# Patient Record
Sex: Male | Born: 1960
Health system: Southern US, Community
[De-identification: ages and names within clinical notes are randomized; demographics above are authoritative.]

## PROBLEM LIST (undated history)

## (undated) DIAGNOSIS — M1990 Unspecified osteoarthritis, unspecified site: Secondary | ICD-10-CM

## (undated) DIAGNOSIS — M549 Dorsalgia, unspecified: Secondary | ICD-10-CM

## (undated) DIAGNOSIS — Z8719 Personal history of other diseases of the digestive system: Secondary | ICD-10-CM

## (undated) DIAGNOSIS — G4730 Sleep apnea, unspecified: Secondary | ICD-10-CM

## (undated) DIAGNOSIS — K921 Melena: Secondary | ICD-10-CM

## (undated) HISTORY — DX: Dorsalgia, unspecified: M54.9

## (undated) HISTORY — DX: Sleep apnea, unspecified: G47.30

## (undated) HISTORY — DX: Personal history of other diseases of the digestive system: Z87.19

## (undated) HISTORY — DX: Melena: K92.1

## (undated) HISTORY — PX: KNEE SURGERY: SHX244

## (undated) HISTORY — DX: Unspecified osteoarthritis, unspecified site: M19.90

## (undated) HISTORY — PX: COLONOSCOPY: SHX174

---

## 1999-09-04 ENCOUNTER — Encounter: Payer: Self-pay | Admitting: Emergency Medicine

## 1999-09-04 ENCOUNTER — Emergency Department (HOSPITAL_COMMUNITY): Admission: EM | Admit: 1999-09-04 | Discharge: 1999-09-04 | Payer: Self-pay | Admitting: Emergency Medicine

## 1999-09-09 ENCOUNTER — Ambulatory Visit (HOSPITAL_COMMUNITY): Admission: RE | Admit: 1999-09-09 | Discharge: 1999-09-09 | Payer: Self-pay | Admitting: Urology

## 1999-09-09 ENCOUNTER — Encounter: Payer: Self-pay | Admitting: Urology

## 2007-04-20 ENCOUNTER — Emergency Department (HOSPITAL_COMMUNITY): Admission: EM | Admit: 2007-04-20 | Discharge: 2007-04-20 | Payer: Self-pay | Admitting: Emergency Medicine

## 2007-04-22 ENCOUNTER — Ambulatory Visit (HOSPITAL_COMMUNITY): Admission: RE | Admit: 2007-04-22 | Discharge: 2007-04-22 | Payer: Self-pay | Admitting: Urology

## 2007-10-27 ENCOUNTER — Emergency Department (HOSPITAL_COMMUNITY): Admission: EM | Admit: 2007-10-27 | Discharge: 2007-10-27 | Payer: Self-pay | Admitting: Emergency Medicine

## 2007-11-04 ENCOUNTER — Ambulatory Visit (HOSPITAL_BASED_OUTPATIENT_CLINIC_OR_DEPARTMENT_OTHER): Admission: RE | Admit: 2007-11-04 | Discharge: 2007-11-04 | Payer: Self-pay | Admitting: Otolaryngology

## 2007-11-25 HISTORY — PX: OTHER SURGICAL HISTORY: SHX169

## 2008-07-18 ENCOUNTER — Observation Stay (HOSPITAL_COMMUNITY): Admission: EM | Admit: 2008-07-18 | Discharge: 2008-07-19 | Payer: Self-pay | Admitting: Emergency Medicine

## 2008-07-18 ENCOUNTER — Ambulatory Visit: Payer: Self-pay | Admitting: Family Medicine

## 2008-07-26 ENCOUNTER — Ambulatory Visit: Payer: Self-pay

## 2009-08-26 ENCOUNTER — Emergency Department (HOSPITAL_COMMUNITY): Admission: EM | Admit: 2009-08-26 | Discharge: 2009-08-26 | Payer: Self-pay | Admitting: Family Medicine

## 2010-07-05 ENCOUNTER — Ambulatory Visit: Payer: Self-pay | Admitting: Family Medicine

## 2010-07-05 DIAGNOSIS — S336XXA Sprain of sacroiliac joint, initial encounter: Secondary | ICD-10-CM | POA: Insufficient documentation

## 2010-07-05 DIAGNOSIS — Z87442 Personal history of urinary calculi: Secondary | ICD-10-CM | POA: Insufficient documentation

## 2010-07-05 DIAGNOSIS — M545 Low back pain: Secondary | ICD-10-CM | POA: Insufficient documentation

## 2010-07-12 ENCOUNTER — Telehealth (INDEPENDENT_AMBULATORY_CARE_PROVIDER_SITE_OTHER): Payer: Self-pay | Admitting: *Deleted

## 2010-09-17 IMAGING — CR DG ANKLE COMPLETE 3+V*R*
3 series · 3 of 3 positions shown · non-contrast
Comparison: None

CLINICAL DATA: Posterior ankle pain for 3 days.  No known injury.

RIGHT ANKLE - COMPLETE 3+ VIEW

[view not recorded (1 of 3)]
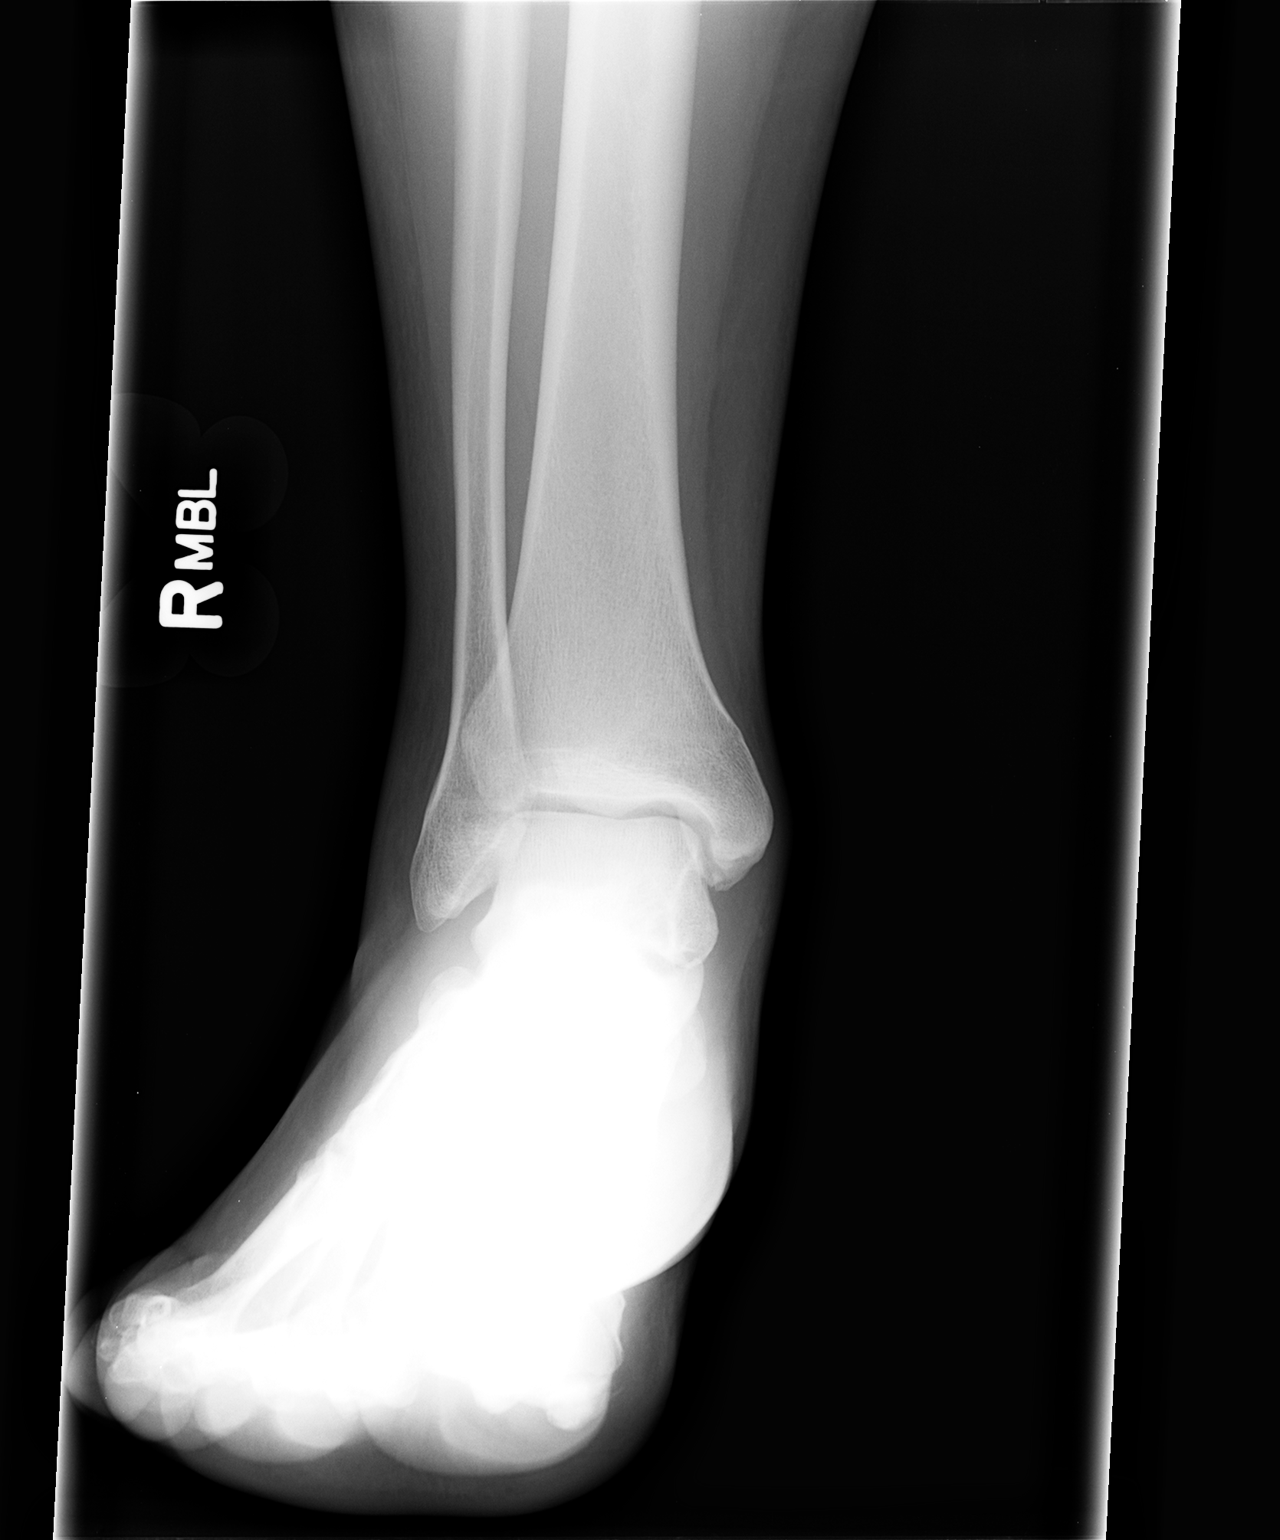

[view not recorded (2 of 3)]
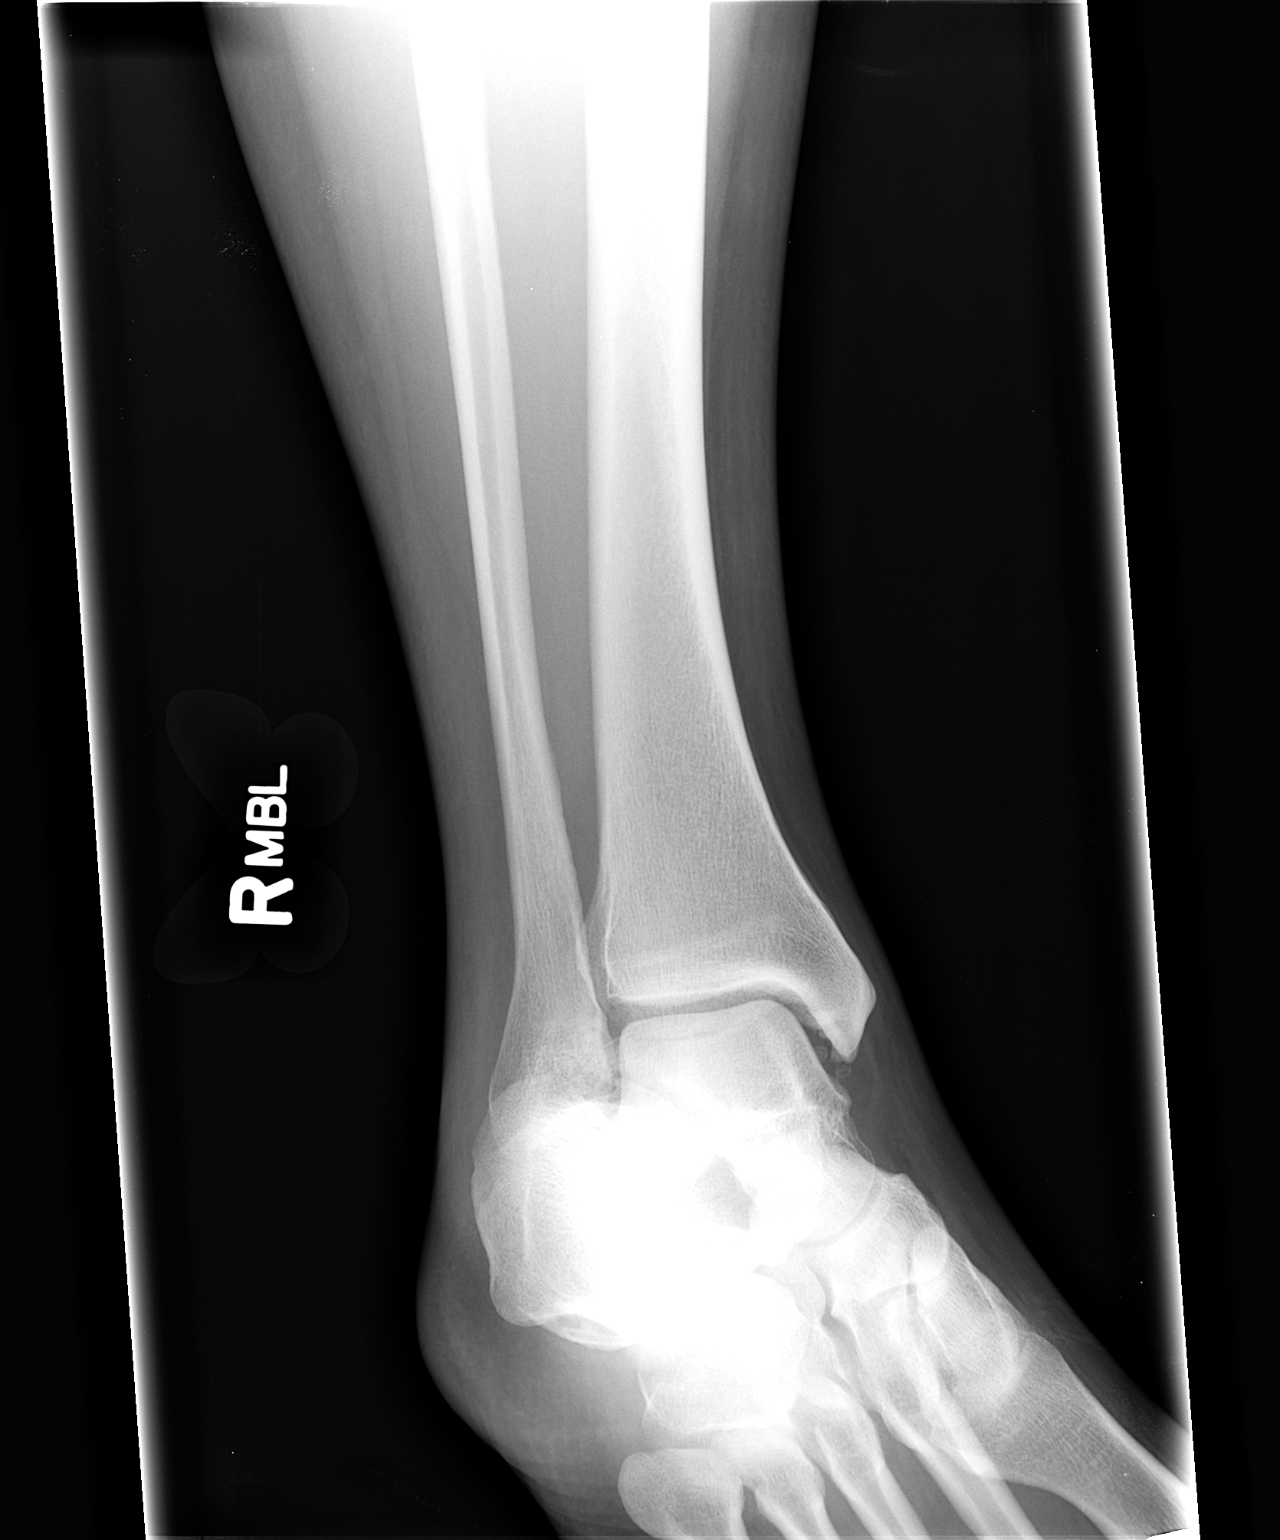

[view not recorded (3 of 3)]
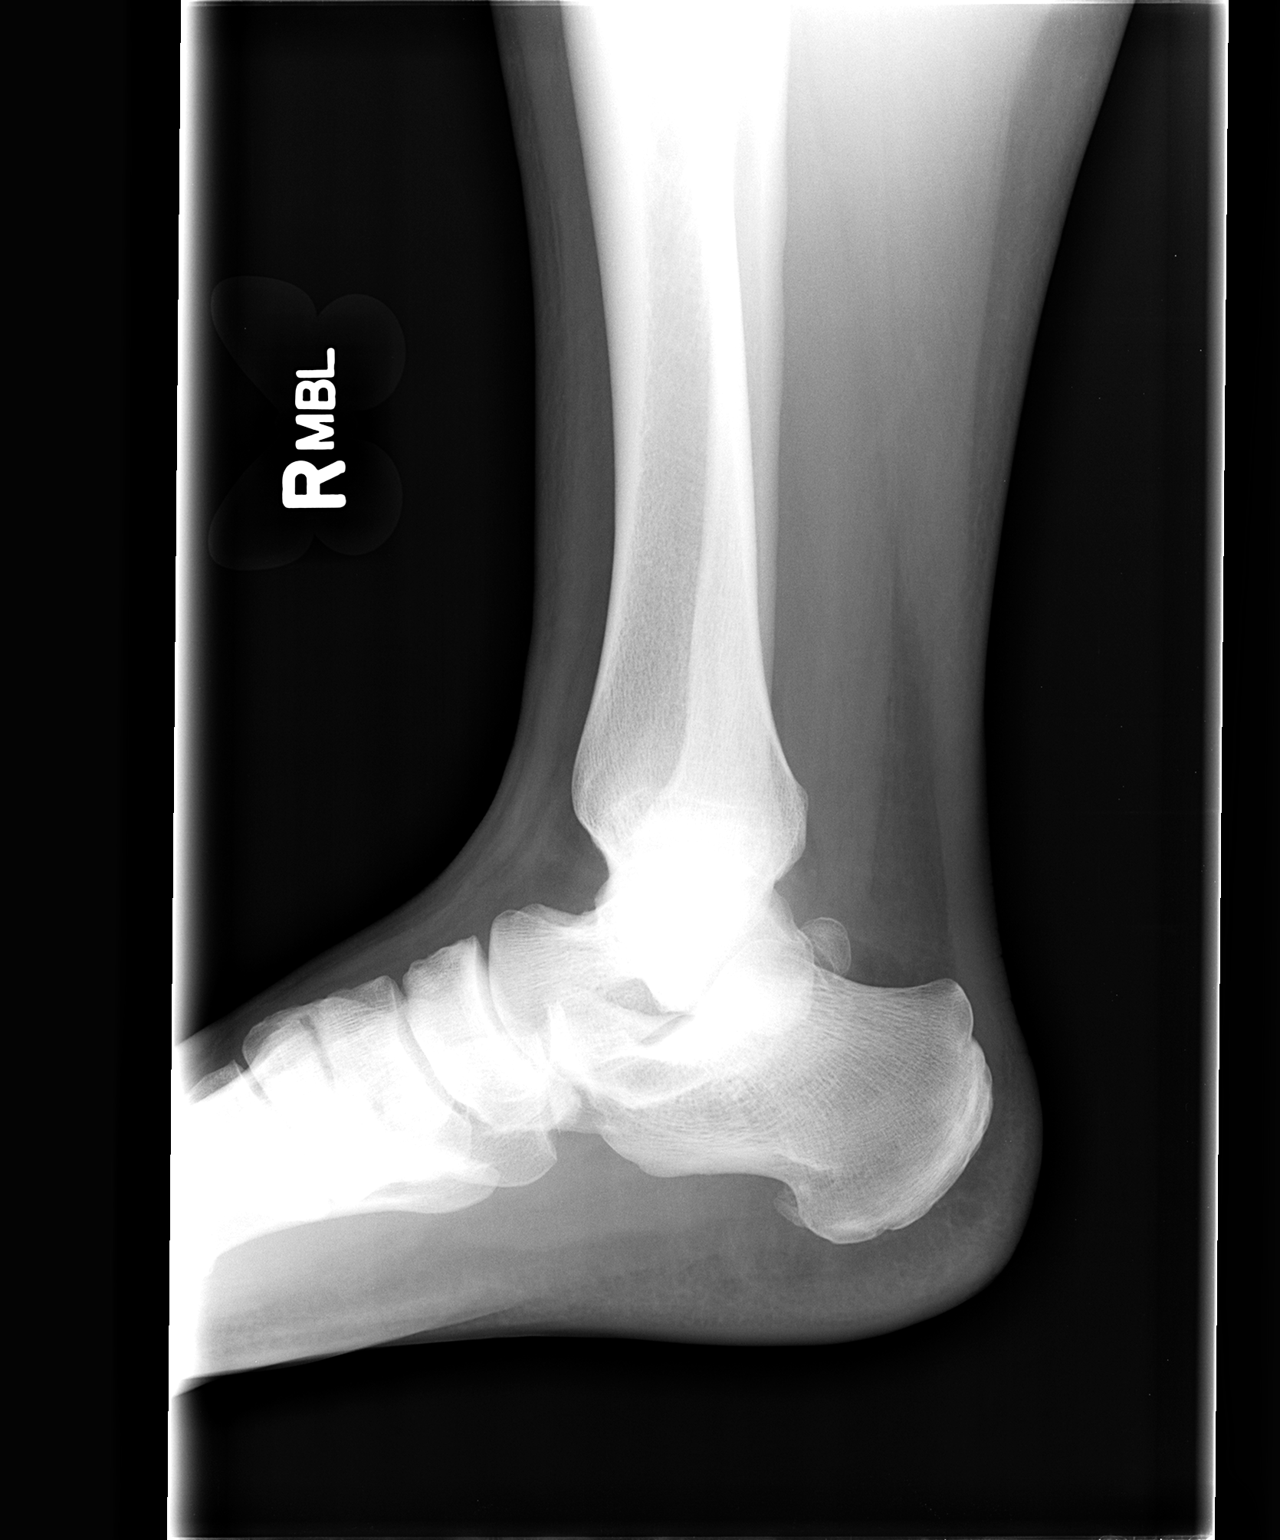

[3 of 3 positions shown; findings below may reference images not displayed]

FINDINGS: Mineralization and alignment are normal.  There is no
evidence of acute fracture or dislocation.  No focal soft tissue
swelling is evident.  Ossific densities adjacent to the medial
malleolus are best seen on the oblique view and do not appear
acute.  These may be related to prior trauma.
IMPRESSION: No acute osseous findings.

## 2010-12-24 NOTE — Progress Notes (Signed)
  Phone Note Outgoing Call Call back at [facility] Phone [phone]   Call placed by: Lajean Saver RN,  July 12, 2010 1:05 PM Call placed to: Patient Details for Reason: Call back Summary of Call: Call back- no answer, left message on machine stating reason for call and to call back with any further questions.

## 2010-12-24 NOTE — Assessment & Plan Note (Signed)
Summary: LOW BACK PAIN/WB   Vital Signs:  Patient Profile:   50 Years Old Male CC:      low back pain and right testicle pain 2 weeks Height:     68 inches Weight:      244 pounds O2 Sat:      98 % O2 treatment:    Room Air Temp:     98.0 degrees F oral Pulse rate:   58 / minute Resp:     14 per minute BP sitting:   115 / 80  (left arm) Cuff size:   large  Pt. in pain?   yes    Location:   lower back    Intensity:   8    Type:       sharp  Vitals Entered By: Lajean Saver RN (July 05, 2010 9:06 AM)                   Updated Prior Medication List: No Medications History of Present Illness Chief Complaint: low back pain and right testicle pain 2 weeks History of Present Illness:  Subjective:  Patient complains of several week history of vague mild right lower back tenderness that has become worse over the past week.  The pain is dull and radiates to his right lateral/anterior thigh and somewhat to the right testicle.  The pain resolves when he is supine on his back and is worse when bending over and with physical activity.  No urinary symptoms.  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 He has a history of renal stones.  About 2 years ago he had several kidney stones requiring surgical extraction.  No fevers, chills, and sweats. He states that he had a back X-ray about one year ago that was normal.  REVIEW OF SYSTEMS Constitutional Symptoms      Denies fever, chills, night sweats, weight loss, weight gain, and fatigue.  Eyes       Denies change in vision, eye pain, eye discharge, glasses, contact lenses, and eye surgery. Ear/Nose/Throat/Mouth       Denies hearing loss/aids, change in hearing, ear pain, ear discharge, dizziness, frequent runny nose, frequent nose bleeds, sinus problems, sore throat, hoarseness, and tooth pain or bleeding.  Respiratory       Denies dry  cough, productive cough, wheezing, shortness of breath, asthma, bronchitis, and emphysema/COPD.  Cardiovascular       Denies murmurs, chest pain, and tires easily with exhertion.    Gastrointestinal       Denies stomach pain, nausea/vomiting, diarrhea, constipation, blood in bowel movements, and indigestion. Genitourniary       Denies painful urination, kidney stones, and loss of urinary control. Neurological       Denies paralysis, seizures, and fainting/blackouts. Musculoskeletal       Complains of muscle pain.      Denies joint pain, joint stiffness, decreased range of motion, redness, swelling, muscle weakness, and gout.      Comments: low back pain radiating down right leg Skin       Denies bruising, unusual mles/lumps or sores, and hair/skin or nail changes.  Psych       Denies mood changes, temper/anger issues, anxiety/stress, speech problems, depression, and sleep problems. Other Comments: patient c/o lowe back pain that radiates down his right leg and pain in his right tesdticle x 2 weeks. Denies changes in urinary pattern  Past History:  Past Medical History: kidney stones  Past Surgical History: Denies surgical history  Family History: Family History of Prostate CA 1st degree relative <50- father  Social History: Theatre stage manager Never Smoked Alcohol use-no Drug use-no Smoking Status:  never Drug Use:  no   Objective:  Appearance:  Patient appears healthy, stated age, and in no acute distress  Neck:  nontender Lungs:  Clear to auscultation.  Breath sounds are equal.  Heart:  Regular rate and rhythm without murmurs, rubs, or gallops.  Abdomen:  Nontender without masses or hepatosplenomegaly.  Bowel sounds are present.  No CVA or flank tenderness.   Back:  Full range of motion.  There is distinct tenderness over the right SI joint.  FABER test cause low back tenderness.  Log roll test causes some right low back tenderness.  Straight leg raising test is negative.   Sitting knee extension test is negative.  Strength and sensation in the lower extremities is normal.  Patellar and achilles reflexes are normal.   Genitourinary:  Penis normal without lesions or urethral discharge.  Scrotum is normal.  Testes are descended bilaterally without nodules or tenderness.  No hernias are palpated.  No regional lymphadenopathy palpated.   Extremities:  No edema.  Pedal pulses are full and equal.  No tenderness over the right greater trochanter. urinalysis (dipstick):  negative Assessment New Problems: NEPHROLITHIASIS, HX OF (ICD-V13.01) SACROILIAC STRAIN (ICD-846.9) BACK PAIN, LUMBAR (ICD-724.2)  MUSCULOSKELETAL PAIN:  RIGHT SACROILIAC JOINT.  Plan New Medications/Changes: NAPROXEN 500 MG TABS (NAPROXEN) One by mouth two times a day pc  #20 x 1, 07/05/2010, Donna Christen MD  New Orders: Urinalysis [81003-65000] New Patient Level III [99203] Planning Comments:   Begin Naproxen.  Begin applying ice pack 2 or 3 times daily.  Begin exercises for SI joint ([employer] information and instruction patient handout given)  Avoid heavy lifting. If not improved 3 to 4 weeks, recommend evaluation by [facility].   The patient and/or caregiver has been counseled thoroughly with regard to medications prescribed including dosage, schedule, interactions, rationale for use, and possible side effects and they verbalize understanding.  Diagnoses and expected course of recovery discussed and will return if not improved as expected or if the condition worsens. Patient and/or caregiver verbalized understanding.  Prescriptions: NAPROXEN 500 MG TABS (NAPROXEN) One by mouth two times a day pc  #20 x 1   Entered and Authorized by:   Donna Christen MD   Signed by:   Donna Christen MD on 07/05/2010   Method used:   Print then Give to Patient   RxID:   [id number]   Orders Added: 1)  Urinalysis [81003-65000] 2)  New Patient Level III [14782]  Appended Document:  LOW BACK PAIN/WB UA: Glucose: Negative Bil:Neg Ket: Neg SG>=1.030 Blood: Negative pH 5.5 Protein: trace URO: 0.2 Nit: Neg NFA:[id number] Yellow and cloudy Lajean Saver, RN

## 2011-02-27 LAB — POCT I-STAT, CHEM 8
BUN: 18 mg/dL (ref 6–23)
Calcium, Ion: 1.15 mmol/L (ref 1.12–1.32)
Glucose, Bld: 92 mg/dL (ref 70–99)
Hemoglobin: 14.6 g/dL (ref 13.0–17.0)
Potassium: 4.4 mEq/L (ref 3.5–5.1)
Sodium: 139 mEq/L (ref 135–145)

## 2011-02-27 LAB — TSH: TSH: 1.265 u[IU]/mL (ref 0.350–4.500)

## 2011-04-08 NOTE — Op Note (Signed)
NAME:  RAFAY, DAHAN NO.:  [id number]   MEDICAL RECORD NO.:  [id number]          PATIENT TYPE:  AMB   LOCATION:  DAY                          FACILITY:  [facility]   PHYSICIAN:  Heloise Purpura, MD      DATE OF BIRTH:  [birth year]   DATE OF PROCEDURE:  04/22/2007  DATE OF DISCHARGE:                               OPERATIVE REPORT   PREOPERATIVE DIAGNOSIS:  Right ureteral calculus.   POSTOPERATIVE DIAGNOSIS:  Right ureteral calculus.   PROCEDURE:  1. Cystoscopy.  2. Right retrograde pyelography.  3. Right ureteroscopy with stone removal.  4. Right ureteral stent placement.   SURGEON:  Heloise Purpura, M.D.   ANESTHESIA:  General.   COMPLICATIONS:  None.   INDICATIONS:  Mr. Nemetz is a 50 year old gentleman with a distal right  ureteral calculus measuring approximately 4 mm.  After discussing  management options, he elected to proceed with ureteroscopic stone  removal.  The potential risks and benefits of the above procedures were  discussed with the patient and informed consent was obtained.   DESCRIPTION OF PROCEDURE:  The patient was taken to the operating room  and a general anesthetic was administered.  He was given preoperative  antibiotics, placed in the dorsal lithotomy position, and prepped and  draped in the usual sterile fashion.  Next, a preoperative time out was  performed.  Cystourethroscopy was performed which demonstrated no  obvious bladder lesions or abnormalities.  The right ureteral orifice  was then identified and cannulated with a 6 French ureteral catheter.  Retrograde pyelography was performed which demonstrated a filling defect  in the distal ureter consistent with the patient's known stone.  A 0.038  sensor guidewire was then advanced into the ureter and up into the renal  pelvis under fluoroscopic guidance without difficulty.  The distal  ureter was then dilated with a 4 cm ureteral balloon dilator.  The 6  French semi-rigid ureteroscope  was then used to perform ureteroscopy.  The patient's stone was identified and was extracted with a nitinol  basket.  This was sent for stone analysis.  The patient's wire was then  back loaded over the cystoscope sheath and a 6 x 24 double-J ureteral  stent with a string tether catheter was placed.  The stent was  positioned under fluoroscopic and cystoscopic guidance.  The wire was removed with a good curl noted in the renal pelvis and a  curl in the bladder.  The patient's bladder was then emptied and the  procedure was ended.  He tolerated the procedure well without  complications.  He was able to be awakened and transferred to the  recovery unit in satisfactory condition.           ______________________________  Heloise Purpura, MD  Electronically Signed     LB/MEDQ  D:  04/22/2007  T:  04/22/2007  Job:  [id number]

## 2011-04-08 NOTE — H&P (Signed)
NAME:  Christopher Phelps, Christopher Phelps NO.:  [id number]   MEDICAL RECORD NO.:  [id number]          PATIENT TYPE:  EMS   LOCATION:  MAJO                         FACILITY:  [facility]   PHYSICIAN:  Nestor Ramp, MD        DATE OF BIRTH:  1961/07/26   DATE OF ADMISSION:  07/18/2008  DATE OF DISCHARGE:                              HISTORY & PHYSICAL   PRIMARY CARE Emmajane Altamura:  [facility].   CHIEF COMPLAINT:  Chest pain.   HISTORY OF PRESENT ILLNESS:  This is a 50 year old male with no past  medical history presenting with chest pain.  The patient has been having  this on and off times 1 month, occurs daily, not strongly associated  with exertion.  Sometimes, occurs with changes in position, and  sometimes, occurs with a sensation of a pounding heart.  This time, the  pain was relieved by nitroglycerine.  The patient describes the pain as  a pressure in the middle of chest.  The pain was never associated with  syncope or shortness of breath.  It is not pleuritic, it is not  reproducible by palpation.  Occasionally, radiates to the jaw and to the  left arm.  The patient also complained of a headache with blurry vision,  so the ED did a head CT.  The patient states that he grinds his teeth at  night and the pain goes away as the day progresses, it is relieved by  ibuprofen, and it has never awakened the patient from sleep.   PAST MEDICAL HISTORY:  Consistent only for a kidney stone in 2008, for  which the patient had a ureteral stent and the patient also has a  history of nasal fracture with closed reduction.   ALLERGIES:  Only to PENICILLIN.   The patient takes no medications.   FAMILY HISTORY:  Mother has a history of AFib and tachybrady syndrome  with a pacemaker.  There is no history of MI in the family.   SOCIAL HISTORY:  The patient works as a IT sales professional.  There is no  tobacco use, occasional EtOH use.  No illicit drugs.  Lives at home with  his wife  and children.   REVIEW OF SYSTEMS:  Denies fevers, chills, illness, cough, sore throat,  abdominal pain, nausea, vomiting, diarrhea, bloody or melanotic stool,  hematuria, dysuria, myalgias, arthralgia, numbness, tingling, or  weakness.   On exam, the patient's vital signs, his blood pressure was 112/68, pulse  63, respirations 18, temperature 97.5 degrees Fahrenheit, O2 saturation  98% on room air.  GENERAL:  The patient is alert and oriented x3 in no acute distress.  HEENT:  The patient is normocephalic and atraumatic.  Pupils are equal,  round, and reactive to light.  Extraocular muscles are intact.  Tympanic  membranes are normal bilaterally. Oropharynx are normal.  NECK:  Supple with no masses, no JVD, and no bruits.  CARDIAC:  Regular rate and rhythm with no murmurs, rubs, or gallops.  LUNGS:  Clear to auscultation bilaterally.  ABDOMEN:  Soft, nontender, and  nondistended.  Positive bowel sounds with  no masses.  EXTREMITIES:  The patient was moving all 4.  There was no edema and  pulses were equal.  NEUROLOGIC:  Cranial nerves II-XII within normal limits.  Strength 5/5  x4.  Sensation grossly intact.  Cerebellar function grossly intact.   LABS:  CBC, white count was 7.5, hemoglobin 14.9, hematocrit 44.2, and  platelets 249.  BMP, sodium 137, potassium is 5.7 found to be hemolyzed  and was 4.01 repeat, chloride was 103, bicarb 24, BUN 19, creatinine is  0.93, glucose is 87, calcium is 9.4, total protein 7.2, albumin 4.5,  alkaline phosphatase 66, AST 36, ALT 19, and total bilirubin 1.5.  BNP  was less than 30.  Chest x-ray was negative and head CT was negative.  Cardiac enzymes, point of care, were negative x2.   ASSESSMENT AND PLAN:  This is a 50 year old male approaching with chest  pain.  1. Chest pain.  We will rule out myocardial infarction, admit to      telemetry floor, placed on telemetry floor for observation.  We      will also do a fasting lipid panel to risk  stratify.  The patient      will have cardiac enzymes drawn x2 and will make a cardiac followup      on discharge.  The chest pain may me cardiac in origin versus      gastroesophageal reflux disease, so we will also do Protonix.  2. Headache, likely secondary to teeth grinding.  3. Disposition when cardiac enzymes are negative x2 and we will      discharge the patient with a followup to [facility] for a      outpatient stress test.      Rodney Langton, MD  Electronically Signed      Nestor Ramp, MD  Electronically Signed    TT/MEDQ  D:  07/18/2008  T:  07/19/2008  Job:  [id number]

## 2011-04-08 NOTE — Discharge Summary (Signed)
NAMELONDYN, WOTTON NO.:  [id number]   MEDICAL RECORD NO.:  [id number]          PATIENT TYPE:  OBV   LOCATION:  3704                         FACILITY:  [facility]   PHYSICIAN:  Nestor Ramp, MD        DATE OF BIRTH:  [birth year]   DATE OF ADMISSION:  07/18/2008  DATE OF DISCHARGE:  07/19/2008                               DISCHARGE SUMMARY   REASON FOR ADMISSION:  Chest pain.   HISTORY:  Briefly, this is a 50 year old male with no significant past  medical history who had been having chest pain on and off for 1 month;  it was daily, not strongly associated with exertion, sometimes occurred  with changes in position, and sometimes with a sensation of the pounding  heart.  This time, pain was relieved with nitroglycerin in the  ambulance.  It was never associated with syncope, never associated with  shortness of breath, not pleuritic, not reproducible by palpation, and  occasionally radiating to the jaw and left arm.   BRIEF HOSPITAL COURSE:  We admitted the patient to telemetry, drew  cardiac enzymes x2, and drew a fasting lipid panel.  Cardiac enzymes  were both negative.  EKG was normal and fasting lipid panel showed total  cholesterol of 17, triglycerides 78, HDL 40, LDL 114 on his labs,  however, myoglobin was increased to 238 and total CK was at 357 on his  last CK.  BNP was normal at discharge at 139 sodium, potassium 3.8,  chloride 104, bicarb 27, BUN 19, creatinine 0.88, and glucose 86.  The  patient's BNP level was less than 30.  Liver function tests were normal.  The patient's discharge condition is stable.   Discharge disposition will be to home.   Followups will be to [facility], Dr. Merla Riches, phone number there is [phone], July 26, 2008, at 3:15 p.m. and to [facility], the  number is [phone] on July 26, 2008, at 9:15 a.m. for an outpatient  stress test.   DISCHARGE MEDICATIONS:  Aspirin 81 mg p.o. daily.  Nitroglycerin 0.4 mg SL PRN  chest pain   INSTRUCTIONS:  No work or physical exertion until outpatient stress  test.  No Xanax before stress test.   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  We will follow this  up in a couple of months to make sure that they have come down.  Also,  the patient's total bilirubin was 1.5, which I suspect may be a Gilbert  syndrome.  This will also need to be followed up and clinically  correlated.      Rodney Langton, MD  Electronically Signed      Nestor Ramp, MD  Electronically Signed    TT/MEDQ  D:  07/19/2008  T:  07/20/2008  Job:  [id number]

## 2011-04-08 NOTE — Op Note (Signed)
Christopher Phelps, Christopher Phelps NO.:  [id number]   MEDICAL RECORD NO.:  [id number]          PATIENT TYPE:  AMB   LOCATION:  DSC                          FACILITY:  [facility]   PHYSICIAN:  Antony Contras, MD     DATE OF BIRTH:  [birth year]   DATE OF PROCEDURE:  11/04/2007  DATE OF DISCHARGE:                               OPERATIVE REPORT   PREOPERATIVE DIAGNOSIS:  Nasal fracture.   POSTOPERATIVE DIAGNOSIS:  Nasal fracture.   PROCEDURE:  Closed reduction of nasal fracture.   SURGEON:  Dr. Christia Reading.   ANESTHESIA:  MAC   COMPLICATIONS:  None.   INDICATIONS:  The patient is a 50 year old white male who was struck in  the nose with attic stairs about 10 days ago.  He sustained a concussion  as well as the nasal fracture.  As swelling has come down, the nose is a  little bit crooked to the left.  He presents to the operating room for  surgical management.   FINDINGS:  The septum is deviated toward the right and the right nasal  sidewall is concave.   DESCRIPTION OF PROCEDURE:  The patient is identified in holding room and  informed consent having been obtained including discussion of risks,  benefits, alternatives, the patient was brought to the operative suite  and put on the operating table in supine position.  Anesthesia was  induced.  The patient was maintained via mask ventilation.  Afrin  pledgets were placed in both sides of the nose for several minutes and  then removed.  A butter knife was then inserted in the nose and used to  push on the septum toward the left side.  It was then inserted  underneath the right nasal bone then used to elevate out the nasal bone  in bimanual method.  The left side was found to be stable.  The right  nasal bone, however fell back into the nose easily.  Thus, pledgets were  replaced in the nose for several minutes and removed.  A small Merocel  pack was then cut and placed up on the underneath the right nasal bone  to hold in  place.  Afrin was added to the pack.  After this, benzoin was  placed on the outer nose followed by Steri-Strips and a thermoplastic  splint.  The splint had to be replaced due to the anesthesia mask  deforming it.  After this was completed, the patient was returned  Anesthesia for wake-up and was removed recovery room in stable  condition.      Antony Contras, MD  Electronically Signed     DDB/MEDQ  D:  11/04/2007  T:  11/04/2007  Job:  [id number]

## 2012-01-14 ENCOUNTER — Encounter: Payer: Self-pay | Admitting: Gastroenterology

## 2012-02-18 ENCOUNTER — Encounter: Payer: Self-pay | Admitting: Gastroenterology

## 2012-08-09 ENCOUNTER — Other Ambulatory Visit: Payer: Self-pay | Admitting: Occupational Medicine

## 2012-08-09 ENCOUNTER — Ambulatory Visit (HOSPITAL_BASED_OUTPATIENT_CLINIC_OR_DEPARTMENT_OTHER)
Admission: RE | Admit: 2012-08-09 | Discharge: 2012-08-09 | Disposition: A | Discharge status: Home / Self Care | Payer: Managed Care, Other (non HMO) | Source: Ambulatory Visit | Attending: Occupational Medicine | Admitting: Occupational Medicine

## 2012-08-09 DIAGNOSIS — Z0000 Encounter for general adult medical examination without abnormal findings: Secondary | ICD-10-CM

## 2012-09-07 ENCOUNTER — Encounter: Payer: Self-pay | Admitting: Gastroenterology

## 2012-10-15 ENCOUNTER — Ambulatory Visit (INDEPENDENT_AMBULATORY_CARE_PROVIDER_SITE_OTHER): Payer: Managed Care, Other (non HMO) | Admitting: Family Medicine

## 2012-10-15 VITALS — BP 120/80 | HR 77 | Temp 98.4°F | Resp 18 | Ht 70.0 in | Wt 256.8 lb

## 2012-10-15 DIAGNOSIS — R062 Wheezing: Secondary | ICD-10-CM

## 2012-10-15 DIAGNOSIS — J40 Bronchitis, not specified as acute or chronic: Secondary | ICD-10-CM

## 2012-10-15 MED ORDER — AZITHROMYCIN 250 MG PO TABS: ORAL_TABLET | ORAL | Status: DC

## 2012-10-15 MED ORDER — HYDROCODONE-HOMATROPINE 5-1.5 MG/5ML PO SYRP: 5.0000 mL | ORAL_SOLUTION | Freq: Three times a day (TID) | ORAL | Status: DC | PRN

## 2012-10-15 MED ORDER — ALBUTEROL SULFATE (2.5 MG/3ML) 0.083% IN NEBU
2.5000 mg | INHALATION_SOLUTION | Freq: Once | RESPIRATORY_TRACT | Status: AC
  Administered 2012-10-15: 2.5 mg via RESPIRATORY_TRACT

## 2012-10-15 MED ORDER — ALBUTEROL SULFATE (2.5 MG/3ML) 0.083% IN NEBU: 2.5000 mg | INHALATION_SOLUTION | Freq: Four times a day (QID) | RESPIRATORY_TRACT | Status: DC | PRN

## 2012-10-15 NOTE — Progress Notes (Signed)
[facility] [address] [place] [address] [phone]  Date:  10/15/2012   Name:  Christopher Phelps   DOB:  1960/11/29   MRN:  [id number]  PCP:  No primary provider on file.    Chief Complaint: Cough, Nasal Congestion and Sore Throat   History of Present Illness:  Christopher Phelps is a 51 y.o. very pleasant male patient who presents with the following:  He is here today with a cough- he has had the cough for about 4 weeks.  He seemed to get better, but then got worse again about 4- 5 days ago.  He has chest tightness, but no wheezing.   Cough is productive. He also has a ST but the throat does not appear red.   He has not noted a fever at least not recently.  He does have body aches but no chills.    He does have sneezing and runny nose, but no severe sinus congestion.   No GI symptoms.    He is generally healthy except for chronic back pain.    He tried tylenol cold and sinus medications and some advil for HA.    Patient Active Problem List  Diagnosis  . BACK PAIN, LUMBAR  . SACROILIAC STRAIN  . NEPHROLITHIASIS, HX OF    History reviewed. No pertinent past medical history.  Past Surgical History  Procedure Date  . Kidney stones 2009    History  Substance Use Topics  . Smoking status: Never Smoker   . Smokeless tobacco: Never Used  . Alcohol Use: 1.5 oz/week    3 drink(s) per week    Family History  Problem Relation Age of Onset  . Heart disease Father     Allergies  Allergen Reactions  . Penicillins     rash    Medication list has been reviewed and updated.  No current outpatient prescriptions on file prior to visit.    Review of Systems:  As per HPI- otherwise negative.   Physical Examination: Filed Vitals:   10/15/12 0810  BP: 120/80  Pulse: 77  Temp: 98.4 F (36.9 C)  Resp: 18   Filed Vitals:   10/15/12 0810  Height: 5\' 10"  (1.778 m)  Weight: 256 lb 12.8 oz (116.484 kg)   Body mass index is 36.85  kg/(m^2). Ideal Body Weight: Weight in (lb) to have BMI = 25: 173.9   GEN: WDWN, NAD, Non-toxic, A & O x 3, overweight HEENT: Atraumatic, Normocephalic. Neck supple. No masses, No LAD. Bilateral TM wnl, oropharynx normal.  PEERL,EOMI.   Nasal congestion.  Ears and Nose: No external deformity. CV: RRR, No M/G/R. No JVD. No thrill. No extra heart sounds. PULM: CTA B, no wheezes, crackles, rhonchi. No retractions. No resp. distress. No accessory muscle use.  Reduced air movement EXTR: No c/c/e NEURO Normal gait.  PSYCH: Normally interactive. Conversant. Not depressed or anxious appearing.  Calm demeanor.   Albuterol neb: helped him to breathe more easily.  Did not change O2 sat   Assessment and Plan: 1. Bronchitis  azithromycin (ZITHROMAX) 250 MG tablet, HYDROcodone-homatropine (HYCODAN) 5-1.5 MG/5ML syrup, DISCONTINUED: albuterol (PROVENTIL) (2.5 MG/3ML) 0.083% nebulizer solution  2. Wheezing  albuterol (PROVENTIL) (2.5 MG/3ML) 0.083% nebulizer solution 2.5 mg   Treat as above.  He did not desire an albuterol inhaler for home.  Hycodan as needed for cough but cautioned about sedation.  See patient instructions for more details.     Abbe Amsterdam, MD

## 2012-10-15 NOTE — Patient Instructions (Addendum)
Please give me a call if you are not better in the next few days- Sooner if worse.    

## 2012-11-11 ENCOUNTER — Encounter: Payer: Self-pay | Admitting: Gastroenterology

## 2012-11-24 ENCOUNTER — Ambulatory Visit (INDEPENDENT_AMBULATORY_CARE_PROVIDER_SITE_OTHER): Payer: Managed Care, Other (non HMO) | Admitting: Emergency Medicine

## 2012-11-24 VITALS — BP 124/84 | HR 74 | Temp 98.2°F | Resp 17 | Ht 69.5 in | Wt 256.0 lb

## 2012-11-24 DIAGNOSIS — M5430 Sciatica, unspecified side: Secondary | ICD-10-CM

## 2012-11-24 MED ORDER — HYDROCODONE-ACETAMINOPHEN 5-325 MG PO TABS: 1.0000 | ORAL_TABLET | ORAL | Status: DC | PRN

## 2012-11-24 MED ORDER — CYCLOBENZAPRINE HCL 10 MG PO TABS: 10.0000 mg | ORAL_TABLET | Freq: Three times a day (TID) | ORAL | Status: DC | PRN

## 2012-11-24 MED ORDER — NAPROXEN SODIUM 550 MG PO TABS: 550.0000 mg | ORAL_TABLET | Freq: Two times a day (BID) | ORAL | Status: AC

## 2012-11-24 NOTE — Patient Instructions (Signed)
Sciatica Sciatica is pain, weakness, numbness, or tingling along the path of the sciatic nerve. The nerve starts in the lower back and runs down the back of each leg. The nerve controls the muscles in the lower leg and in the back of the knee, while also providing sensation to the back of the thigh, lower leg, and the sole of your foot. Sciatica is a symptom of another medical condition. For instance, nerve damage or certain conditions, such as a herniated disk or bone spur on the spine, pinch or put pressure on the sciatic nerve. This causes the pain, weakness, or other sensations normally associated with sciatica. Generally, sciatica only affects one side of the body. CAUSES   Herniated or slipped disc.  Degenerative disk disease.  A pain disorder involving the narrow muscle in the buttocks (piriformis syndrome).  Pelvic injury or fracture.  Pregnancy.  Tumor (rare). SYMPTOMS  Symptoms can vary from mild to very severe. The symptoms usually travel from the low back to the buttocks and down the back of the leg. Symptoms can include:  Mild tingling or dull aches in the lower back, leg, or hip.  Numbness in the back of the calf or sole of the foot.  Burning sensations in the lower back, leg, or hip.  Sharp pains in the lower back, leg, or hip.  Leg weakness.  Severe back pain inhibiting movement. These symptoms may get worse with coughing, sneezing, laughing, or prolonged sitting or standing. Also, being overweight may worsen symptoms. DIAGNOSIS  Your caregiver will perform a physical exam to look for common symptoms of sciatica. He or she may ask you to do certain movements or activities that would trigger sciatic nerve pain. Other tests may be performed to find the cause of the sciatica. These may include:  Blood tests.  X-rays.  Imaging tests, such as an MRI or CT scan. TREATMENT  Treatment is directed at the cause of the sciatic pain. Sometimes, treatment is not necessary  and the pain and discomfort goes away on its own. If treatment is needed, your caregiver may suggest:  Over-the-counter medicines to relieve pain.  Prescription medicines, such as anti-inflammatory medicine, muscle relaxants, or narcotics.  Applying heat or ice to the painful area.  Steroid injections to lessen pain, irritation, and inflammation around the nerve.  Reducing activity during periods of pain.  Exercising and stretching to strengthen your abdomen and improve flexibility of your spine. Your caregiver may suggest losing weight if the extra weight makes the back pain worse.  Physical therapy.  Surgery to eliminate what is pressing or pinching the nerve, such as a bone spur or part of a herniated disk. HOME CARE INSTRUCTIONS   Only take over-the-counter or prescription medicines for pain or discomfort as directed by your caregiver.  Apply ice to the affected area for 20 minutes, 3 4 times a day for the first 48 72 hours. Then try heat in the same way.  Exercise, stretch, or perform your usual activities if these do not aggravate your pain.  Attend physical therapy sessions as directed by your caregiver.  Keep all follow-up appointments as directed by your caregiver.  Do not wear high heels or shoes that do not provide proper support.  Check your mattress to see if it is too soft. A firm mattress may lessen your pain and discomfort. SEEK IMMEDIATE MEDICAL CARE IF:   You lose control of your bowel or bladder (incontinence).  You have increasing weakness in the lower back,   pelvis, buttocks, or legs.  You have redness or swelling of your back.  You have a burning sensation when you urinate.  You have pain that gets worse when you lie down or awakens you at night.  Your pain is worse than you have experienced in the past.  Your pain is lasting longer than 4 weeks.  You are suddenly losing weight without reason. MAKE SURE YOU:  Understand these  instructions.  Will watch your condition.  Will get help right away if you are not doing well or get worse. Document Released: 11/04/2001 Document Revised: 05/11/2012 Document Reviewed: 03/21/2012 Exit[employer] Patient Information 2013 [employer].  

## 2012-11-24 NOTE — Progress Notes (Signed)
[facility] [address] [place] [address] [phone]  Date:  11/24/2012   Name:  Christopher Phelps   DOB:  10-22-1961   MRN:  [id number]  PCP:  No primary provider on file.    Chief Complaint: Abdominal Pain, Leg Pain and Groin Pain   History of Present Illness:  Christopher Phelps is a 52 y.o. very pleasant male patient who presents with the following:  Lifted a stretcher over the weekend when his father was taken to the hospital.  Monday developed right low back pain with radiation into his right thigh and lower abdomen.  Denies numbness or tingling or weakness.  No history of prior sciatic neuritis.  History of prior low back strain.  Patient Active Problem List  Diagnosis  . BACK PAIN, LUMBAR  . SACROILIAC STRAIN  . NEPHROLITHIASIS, HX OF    No past medical history on file.  Past Surgical History  Procedure Date  . Kidney stones 2009    History  Substance Use Topics  . Smoking status: Never Smoker   . Smokeless tobacco: Never Used  . Alcohol Use: 1.5 oz/week    3 drink(s) per week    Family History  Problem Relation Age of Onset  . Heart disease Father     Allergies  Allergen Reactions  . Penicillins     rash    Medication list has been reviewed and updated.  Current Outpatient Prescriptions on File Prior to Visit  Medication Sig Dispense Refill  . azithromycin (ZITHROMAX) 250 MG tablet Use as a zpack  6 tablet  0  . HYDROcodone-homatropine (HYCODAN) 5-1.5 MG/5ML syrup Take 5 mLs by mouth every 8 (eight) hours as needed for cough.  90 mL  0    Review of Systems:  As per HPI, otherwise negative.    Physical Examination: Filed Vitals:   11/24/12 0842  BP: 124/84  Pulse: 74  Temp: 98.2 F (36.8 C)  Resp: 17   Filed Vitals:   11/24/12 0842  Height: 5' 9.5" (1.765 m)  Weight: 256 lb (116.121 kg)   Body mass index is 37.26 kg/(m^2). Ideal Body Weight: Weight in (lb) to have BMI = 25: 171.4   GEN: WDWN, NAD, Non-toxic, A  & O x 3 HEENT: Atraumatic, Normocephalic. Neck supple. No masses, No LAD. Ears and Nose: No external deformity. CV: RRR, No M/G/R. No JVD. No thrill. No extra heart sounds. PULM: CTA B, no wheezes, crackles, rhonchi. No retractions. No resp. distress. No accessory muscle use. ABD: S, NT, ND, +BS. No rebound. No HSM. EXTR: No c/c/e NEURO Normal gait.  PSYCH: Normally interactive. Conversant. Not depressed or anxious appearing.  Calm demeanor.  BACK:  No lumbar tenderness.  SLR positive on right. Some tenderness in right sciatic notch  Assessment and Plan: Sciatic neuritis Anaprox Flexeril Hydrocodone Follow up in 1 week if not improved  Carmelina Dane, MD

## 2012-12-09 ENCOUNTER — Ambulatory Visit (AMBULATORY_SURGERY_CENTER): Payer: Managed Care, Other (non HMO)

## 2012-12-09 VITALS — Ht 70.0 in | Wt 259.0 lb

## 2012-12-09 DIAGNOSIS — Z1211 Encounter for screening for malignant neoplasm of colon: Secondary | ICD-10-CM

## 2012-12-09 MED ORDER — MOVIPREP 100 G PO SOLR: ORAL | Status: DC

## 2012-12-23 ENCOUNTER — Encounter: Payer: Self-pay | Admitting: Gastroenterology

## 2013-01-04 ENCOUNTER — Encounter: Payer: Self-pay | Admitting: Gastroenterology

## 2013-01-04 ENCOUNTER — Ambulatory Visit (AMBULATORY_SURGERY_CENTER): Payer: Managed Care, Other (non HMO) | Admitting: Gastroenterology

## 2013-01-04 VITALS — BP 112/68 | HR 60 | Temp 97.1°F | Resp 13 | Ht 70.0 in | Wt 259.0 lb

## 2013-01-04 DIAGNOSIS — Z1211 Encounter for screening for malignant neoplasm of colon: Secondary | ICD-10-CM

## 2013-01-04 MED ORDER — SODIUM CHLORIDE 0.9 % IV SOLN: 500.0000 mL | INTRAVENOUS | Status: DC

## 2013-01-04 NOTE — Patient Instructions (Addendum)
YOU HAD AN ENDOSCOPIC PROCEDURE TODAY AT THE  ENDOSCOPY CENTER: Refer to the procedure report that was given to you for any specific questions about what was found during the examination.  If the procedure report does not answer your questions, please call your gastroenterologist to clarify.  If you requested that your care partner not be given the details of your procedure findings, then the procedure report has been included in a sealed envelope for you to review at your convenience later.  YOU SHOULD EXPECT: Some feelings of bloating in the abdomen. Passage of more gas than usual.  Walking can help get rid of the air that was put into your GI tract during the procedure and reduce the bloating. If you had a lower endoscopy (such as a colonoscopy or flexible sigmoidoscopy) you may notice spotting of blood in your stool or on the toilet paper. If you underwent a bowel prep for your procedure, then you may not have a normal bowel movement for a few days.  DIET: Your first meal following the procedure should be a light meal and then it is ok to progress to your normal diet.  A half-sandwich or bowl of soup is an example of a good first meal.  Heavy or fried foods are harder to digest and may make you feel nauseous or bloated.  Likewise meals heavy in dairy and vegetables can cause extra gas to form and this can also increase the bloating.  Drink plenty of fluids but you should avoid alcoholic beverages for 24 hours.  ACTIVITY: Your care partner should take you home directly after the procedure.  You should plan to take it easy, moving slowly for the rest of the day.  You can resume normal activity the day after the procedure however you should NOT DRIVE or use heavy machinery for 24 hours (because of the sedation medicines used during the test).    SYMPTOMS TO REPORT IMMEDIATELY: A gastroenterologist can be reached at any hour.  During normal business hours, 8:30 AM to 5:00 PM Monday through Friday,  call [phone].  After hours and on weekends, please call the GI answering service at [phone] who will take a message and have the physician on call contact you.   Following lower endoscopy (colonoscopy or flexible sigmoidoscopy):  Excessive amounts of blood in the stool  Significant tenderness or worsening of abdominal pains  Swelling of the abdomen that is new, acute  Fever of 100F or higher  FOLLOW UP: If any biopsies were taken you will be contacted by phone or by letter within the next 1-3 weeks.  Call your gastroenterologist if you have not heard about the biopsies in 3 weeks.  Our staff will call the home number listed on your records the next business day following your procedure to check on you and address any questions or concerns that you may have at that time regarding the information given to you following your procedure. This is a courtesy call and so if there is no answer at the home number and we have not heard from you through the emergency physician on call, we will assume that you have returned to your regular daily activities without incident.  SIGNATURES/CONFIDENTIALITY: You and/or your care partner have signed paperwork which will be entered into your electronic medical record.  These signatures attest to the fact that that the information above on your After Visit Summary has been reviewed and is understood.  Full responsibility of the confidentiality of this   discharge information lies with you and/or your care-partner.  Repeat colonoscopy in 10 years.  

## 2013-01-04 NOTE — Progress Notes (Signed)
Patient did not experience any of the following events: a burn prior to discharge; a fall within the facility; wrong site/side/patient/procedure/implant event; or a hospital transfer or hospital admission upon discharge from the facility. (G8907) Patient did not have preoperative order for IV antibiotic SSI prophylaxis. (G8918)  

## 2013-01-04 NOTE — Op Note (Signed)
[facility] [address] [place], [address]   COLONOSCOPY PROCEDURE REPORT  PATIENT: Christopher Phelps, Christopher Phelps  MR#: [id number] BIRTHDATE: [birth year] , 52  yrs. old GENDER: Male ENDOSCOPIST: Meryl Dare, MD, [facility] PROCEDURE DATE:  01/04/2013 PROCEDURE:   Colonoscopy, screening ASA CLASS:   Class II INDICATIONS:average risk screening. MEDICATIONS: MAC sedation, administered by CRNA and propofol (Diprivan) 350mg  IV DESCRIPTION OF PROCEDURE:   After the risks benefits and alternatives of the procedure were thoroughly explained, informed consent was obtained.  A digital rectal exam revealed no abnormalities of the rectum.   The LB [id number]  endoscope was introduced through the anus and advanced to the cecum, which was identified by both the appendix and ileocecal valve. No adverse events experienced.   The quality of the prep was good, using MoviPrep  The instrument was then slowly withdrawn as the colon was fully examined.  COLON FINDINGS: A normal appearing cecum, ileocecal valve, and appendiceal orifice were identified.  The ascending, hepatic flexure, transverse, splenic flexure, descending, sigmoid colon and rectum appeared unremarkable.  No polyps or cancers were seen. Retroflexed views revealed no abnormalities. The time to cecum=1 minutes 22 seconds.  Withdrawal time=10 minutes 15 seconds.  The scope was withdrawn and the procedure completed.  COMPLICATIONS: There were no complications.  ENDOSCOPIC IMPRESSION: 1. Normal colon  RECOMMENDATIONS: 1. You should continue to follow colorectal cancer screening guidelines for "routine risk" patients with a repeat colonoscopy in 10 years.  There is no need for screening FOBT (stool) testing for at least 5 years.   eSigned:  Meryl Dare, MD, [facility] 01/04/2013 9:44 AM   cc: Carollee Massed, MD

## 2013-01-05 ENCOUNTER — Telehealth: Payer: Self-pay | Admitting: *Deleted

## 2013-01-05 NOTE — Telephone Encounter (Signed)
  Follow up Call-  Call back number 01/04/2013  Post procedure Call Back phone  # [phone]  Permission to leave phone message Yes     Patient questions:  Do you have a fever, pain , or abdominal swelling? no Pain Score  0 *  Have you tolerated food without any problems? yes  Have you been able to return to your normal activities? yes  Do you have any questions about your discharge instructions: Diet   no Medications  no Follow up visit  no  Do you have questions or concerns about your Care? no  Actions: * If pain score is 4 or above: No action needed, pain <4.

## 2013-08-24 ENCOUNTER — Encounter: Payer: Self-pay | Admitting: Cardiology

## 2015-10-22 ENCOUNTER — Encounter: Payer: Self-pay | Admitting: Internal Medicine

## 2015-12-14 ENCOUNTER — Emergency Department (INDEPENDENT_AMBULATORY_CARE_PROVIDER_SITE_OTHER): Payer: Managed Care, Other (non HMO)

## 2015-12-14 ENCOUNTER — Emergency Department
Admission: EM | Admit: 2015-12-14 | Discharge: 2015-12-14 | Disposition: A | Discharge status: Home / Self Care | Payer: Managed Care, Other (non HMO) | Source: Home / Self Care | Attending: Family Medicine | Admitting: Family Medicine

## 2015-12-14 ENCOUNTER — Encounter: Payer: Self-pay | Admitting: *Deleted

## 2015-12-14 DIAGNOSIS — R509 Fever, unspecified: Secondary | ICD-10-CM

## 2015-12-14 DIAGNOSIS — R0602 Shortness of breath: Secondary | ICD-10-CM | POA: Diagnosis not present

## 2015-12-14 DIAGNOSIS — J069 Acute upper respiratory infection, unspecified: Secondary | ICD-10-CM

## 2015-12-14 DIAGNOSIS — M940 Chondrocostal junction syndrome [Tietze]: Secondary | ICD-10-CM | POA: Diagnosis not present

## 2015-12-14 DIAGNOSIS — B9789 Other viral agents as the cause of diseases classified elsewhere: Principal | ICD-10-CM

## 2015-12-14 MED ORDER — PREDNISONE 20 MG PO TABS: 20.0000 mg | ORAL_TABLET | Freq: Two times a day (BID) | ORAL | Status: DC

## 2015-12-14 MED ORDER — BENZONATATE 200 MG PO CAPS: 200.0000 mg | ORAL_CAPSULE | Freq: Every day | ORAL | Status: DC

## 2015-12-14 MED ORDER — DOXYCYCLINE HYCLATE 100 MG PO CAPS: 100.0000 mg | ORAL_CAPSULE | Freq: Two times a day (BID) | ORAL | Status: DC

## 2015-12-14 MED ORDER — IBUPROFEN 400 MG PO TABS
400.0000 mg | ORAL_TABLET | Freq: Once | ORAL | Status: AC
  Administered 2015-12-14: 400 mg via ORAL

## 2015-12-14 MED FILL — BENZONATATE 200 MG CAPSULE: 200 | 12 days supply | Qty: 12 | Fill #0

## 2015-12-14 MED FILL — predniSONE 20 MG TABS: 20 | 5 days supply | Qty: 10 | Fill #0

## 2015-12-14 MED FILL — DOXYCYCLINE 100 MG TABLET: 100 | 10 days supply | Qty: 20 | Fill #0

## 2015-12-14 NOTE — ED Notes (Signed)
Pt c/o 2 weeks of cough, congestion, chest sore ness and "sores in throat".

## 2015-12-14 NOTE — Discharge Instructions (Signed)

## 2015-12-14 NOTE — ED Provider Notes (Signed)
CSN: [id number]     Arrival date & time 12/14/15  7253 History   First MD Initiated Contact with Patient 12/14/15 [phone]     Chief Complaint  Patient presents with  . Cough     HPI Comments: 1.5 weeks ago patient developed typical cold-like symptoms including mild sore throat, sinus congestion, low grade fever, fatigue, and cough.   His cough has persisted and become non-productive, worse at night.  He has had bilateral eye redness, now resolved.  Today he had low grade fever to 99.5.  He complains of tightness in his anterior chest with inspiration.  He has developed occasional wheezing and shortness of breath at night. He admits that his colds usually last too long, and he has had several episodes of pneumonia in the past.  He does not have asthma, but he has a family history of asthma (mother)>  The history is provided by the patient and the spouse.    Past Medical History  Diagnosis Date  . Back pain   . History of anal fissures   . Hematochezia    Past Surgical History  Procedure Laterality Date  . Kidney stones  2009    2 times  . Colonoscopy     Family History  Problem Relation Age of Onset  . Heart disease Father   . Prostate cancer Father   . Heart disease Mother    Social History  Substance Use Topics  . Smoking status: Never Smoker   . Smokeless tobacco: Never Used  . Alcohol Use: 1.8 oz/week    3 Shots of liquor per week    Review of Systems + sore throat + cough No pleuritic pain + wheezing + nasal congestion + post-nasal drainage No sinus pain/pressure + itchy/red eyes No earache No hemoptysis + SOB + low grade fever, + chills No nausea No vomiting No abdominal pain No diarrhea No urinary symptoms No skin rash + fatigue No myalgias No headache Used OTC meds without relief  Allergies  Penicillins  Home Medications   Prior to Admission medications   Medication Sig Start Date End Date Taking? Authorizing Provider  benzonatate (TESSALON) 200  MG capsule Take 1 capsule (200 mg total) by mouth at bedtime. Take as needed for cough 12/14/15   Lattie Haw, MD  doxycycline (VIBRAMYCIN) 100 MG capsule Take 1 capsule (100 mg total) by mouth 2 (two) times daily. Take with food. 12/14/15   Lattie Haw, MD  predniSONE (DELTASONE) 20 MG tablet Take 1 tablet (20 mg total) by mouth 2 (two) times daily. Take with food. 12/14/15   Lattie Haw, MD   Meds Ordered and Administered this Visit   Medications  ibuprofen (ADVIL,MOTRIN) tablet 400 mg (400 mg Oral Given 12/14/15 0838)    BP 126/81 mmHg  Pulse 73  Temp(Src) 98.7 F (37.1 C) (Oral)  Resp 16  Wt 264 lb (119.75 kg)  SpO2 95% No data found.   Physical Exam Nursing notes and Vital Signs reviewed. Appearance:  Patient appears stated age, and in no acute distress.  Patient is obese Eyes:  Pupils are equal, round, and reactive to light and accomodation.  Extraocular movement is intact.  Conjunctivae are not inflamed  Ears:  Canals normal.  Tympanic membranes normal.  Nose:   Congested turbinates.  No sinus tenderness.  Pharynx:  Several small aphthous ulcers, otherwise normal. Neck:  Supple.  Tender enlarged posterior nodes are palpated bilaterally  Lungs:  Clear to auscultation.  Breath sounds  are equal.  Moving air well. Chest:  Distinct tenderness to palpation over the mid-sternum.  Heart:  Regular rate and rhythm without murmurs, rubs, or gallops.  Abdomen:  Nontender without masses or hepatosplenomegaly.  Bowel sounds are present.  No CVA or flank tenderness.  Extremities:  No edema.  Skin:  No rash present.   ED Course  Procedures  None   Imaging Review Dg Chest 2 View  12/14/2015  CLINICAL DATA:  Shortness of breath, fever. EXAM: CHEST  2 VIEW COMPARISON:  August 09, 2012. FINDINGS: The heart size and mediastinal contours are within normal limits. Both lungs are clear. No pneumothorax or pleural effusion is noted. The visualized skeletal structures are  unremarkable. IMPRESSION: No active cardiopulmonary disease. Electronically Signed   By: Lupita Raider, M.D.   On: 12/14/2015 08:53      MDM   1. Viral URI with cough; suspect associated bronchospasm   2. Costochondritis    With onset of low grade fever, will begin doxycycline for atypical coverage.  Begin prednisone burst.  Prescription written for Benzonatate (Tessalon) to take at bedtime for night-time cough.  May have mild reactive airways disease (note family history of asthma mother).  Recommend follow-up with PCP; may benefit from controller med such as QVAR.  Take plain guaifenesin (  extended release tabs such as Mucinex) twice daily, with plenty of water, for cough and congestion.  May add Pseudoephedrine ( , one or two every 4 to 6 hours) for sinus congestion.  Get adequate rest.   May use Afrin nasal spray (or generic oxymetazoline) twice daily for about 5 days and then discontinue.  Also recommend using saline nasal spray several times daily and saline nasal irrigation (AYR is a common brand).  Use Flonase nasal spray each morning after using Afrin nasal spray and saline nasal irrigation. Try warm salt water gargles for sore throat.  Stop all antihistamines for now, and other non-prescription cough/cold preparations.   Follow-up with family doctor if not improving about 7 to10 days.  Recommend a Tdap when well.     Lattie Haw, MD 12/14/15 [phone]

## 2017-05-22 ENCOUNTER — Emergency Department
Admission: EM | Admit: 2017-05-22 | Discharge: 2017-05-22 | Disposition: A | Discharge status: Home / Self Care | Payer: Managed Care, Other (non HMO) | Source: Home / Self Care | Attending: Family Medicine | Admitting: Family Medicine

## 2017-05-22 ENCOUNTER — Encounter: Payer: Self-pay | Admitting: *Deleted

## 2017-05-22 DIAGNOSIS — M7022 Olecranon bursitis, left elbow: Secondary | ICD-10-CM | POA: Diagnosis not present

## 2017-05-22 MED ORDER — DOXYCYCLINE HYCLATE 100 MG PO CAPS: 100.0000 mg | ORAL_CAPSULE | Freq: Two times a day (BID) | ORAL | 0 refills | Status: DC

## 2017-05-22 NOTE — ED Provider Notes (Signed)
CSN: [id number]     Arrival date & time 05/22/17  1542 History   First MD Initiated Contact with Patient 05/22/17 1624     Chief Complaint  Patient presents with  . Joint Swelling  . Generalized Body Aches   (Consider location/radiation/quality/duration/timing/severity/associated sxs/prior Treatment) HPI Christopher Phelps is a 56 y.o. male presenting to UC with c/o generalized body aches, fever and chills, along with localized Left elbow pain, swelling, redness, and heat that he noticed yesterday. Mild temporary relief with ibuprofen.  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      Pt also notes he was bit by a tick on his penis about 2 months ago but states he is pretty certain he found the tick the same day it bit him and believes he got it all off.  Past Medical History:  Diagnosis Date  . Back pain   . Hematochezia   . History of anal fissures    Past Surgical History:  Procedure Laterality Date  . COLONOSCOPY    . kidney stones  2009   2 times   Family History  Problem Relation Age of Onset  . Heart disease Father   . Prostate cancer Father   . Heart disease Mother    Social History  Substance Use Topics  . Smoking status: Never Smoker  . Smokeless tobacco: Never Used  . Alcohol use 1.8 oz/week    3 Shots of liquor per week    Review of Systems  Constitutional: Positive for chills and fever.  Musculoskeletal: Positive for arthralgias, joint swelling and myalgias.  Skin: Positive for color change and wound.  Neurological: Negative for weakness and numbness.    Allergies  Penicillins  Home Medications   Prior to Admission medications   Medication Sig Start Date End Date Taking? Authorizing Provider  doxycycline (VIBRAMYCIN) 100 MG capsule Take 1 capsule (100 mg total) by mouth 2 (two) times daily. One po bid x 7 days 05/22/17   Lurene ShadowPhelps,  Clora Ohmer O, PA-C   Meds Ordered and Administered this Visit  Medications - No data to display  BP 131/83 (BP Location: Right Arm)   Pulse 75   Temp 99.3 F (37.4 C) (Oral)   Resp 16   Wt 261 lb (118.4 kg)   SpO2 96%   BMI 37.45 kg/m  No data found.   Physical Exam  Constitutional: He is oriented to person, place, and time. He appears well-developed and well-nourished.  HENT:  Head: Normocephalic and atraumatic.  Eyes: EOM are normal.  Neck: Normal range of motion.  Cardiovascular: Normal rate.   Pulmonary/Chest: Effort normal.  Musculoskeletal: Normal range of motion. He exhibits edema and tenderness.  Left elbow: mild to moderate edema over olecranon bursa, tender. Full ROM elbow.   Neurological: He is alert and oriented to person, place, and time.  Skin: Skin is warm and dry. There is erythema.  Left elbow: 1cm scab with underlying erythema and edema over olecranon bursa. Tender. No red streaking.   Psychiatric: He has a normal mood and affect. His behavior is normal.  Nursing note and vitals reviewed.   Urgent Care Course     Procedures (including critical care time)  Labs Review Labs Reviewed - No data to display  Imaging Review No results found.   MDM   1. Olecranon bursitis,  left elbow    Left elbow: c/w infected olecranon bursa w/o evidence of septic joint. Full ROM elbow joint.   Rx: Doxycycline   Home care instructions provided F/u with PCP as needed, or Sports Medicine if not improving in 3-4 days, especially if worsening.    Lurene Shadow, [place] 05/22/17 1731

## 2017-05-22 NOTE — ED Triage Notes (Signed)
Patient c/o left elbow pain and swelling that started yesterday. Yesterday evening developed chills and body aches that have persisted into today. He has had a scab to his left elbow x 1 week. Took Advil 3 hours ago.

## 2017-05-26 ENCOUNTER — Ambulatory Visit (INDEPENDENT_AMBULATORY_CARE_PROVIDER_SITE_OTHER): Payer: Managed Care, Other (non HMO) | Admitting: Family Medicine

## 2017-05-26 ENCOUNTER — Encounter: Payer: Self-pay | Admitting: *Deleted

## 2017-05-26 ENCOUNTER — Emergency Department
Admission: EM | Admit: 2017-05-26 | Discharge: 2017-05-26 | Disposition: A | Discharge status: Home / Self Care | Payer: Managed Care, Other (non HMO) | Source: Home / Self Care | Attending: Family Medicine | Admitting: Family Medicine

## 2017-05-26 DIAGNOSIS — L03114 Cellulitis of left upper limb: Secondary | ICD-10-CM

## 2017-05-26 LAB — POCT CBC W AUTO DIFF (K'VILLE URGENT CARE)

## 2017-05-26 LAB — SEDIMENTATION RATE: Sed Rate: 40 mm/hr — ABNORMAL HIGH (ref 0–20)

## 2017-05-26 MED ORDER — CEFTRIAXONE SODIUM 1 G IJ SOLR
1.0000 g | Freq: Once | INTRAMUSCULAR | Status: AC
  Administered 2017-05-26: 1 g via INTRAMUSCULAR

## 2017-05-26 MED ORDER — CEFDINIR 300 MG PO CAPS: 300.0000 mg | ORAL_CAPSULE | Freq: Two times a day (BID) | ORAL | 0 refills | Status: DC

## 2017-05-26 NOTE — ED Triage Notes (Signed)
Pt still c/o LT elbow pain, decreased ROM and swelling with fever 100.0-100.5 since his visit on 05/22/17. He is taking doxycyline and advil. He reports a tick bite on the end of his penis 1 mth ago.

## 2017-05-26 NOTE — Patient Instructions (Addendum)
Thank you for coming in today. If you have problems call me on my cell [phone]  Take omnicef twice daily.  Continue doxycycline.  Recheck Thursday.   Earlier the better.    Cellulitis, Adult Cellulitis is a skin infection. The infected area is usually red and tender. This condition occurs most often in the arms and lower legs. The infection can travel to the muscles, blood, and underlying tissue and become serious. It is very important to get treated for this condition. What are the causes? Cellulitis is caused by bacteria. The bacteria enter through a break in the skin, such as a cut, burn, insect bite, open sore, or crack. What increases the risk? This condition is more likely to occur in people who:  Have a weak defense system (immune system).  Have open wounds on the skin such as cuts, burns, bites, and scrapes. Bacteria can enter the body through these open wounds.  Are older.  Have diabetes.  Have a type of long-lasting (chronic) liver disease (cirrhosis) or kidney disease.  Use IV drugs.  What are the signs or symptoms? Symptoms of this condition include:  Redness, streaking, or spotting on the skin.  Swollen area of the skin.  Tenderness or pain when an area of the skin is touched.  Warm skin.  Fever.  Chills.  Blisters.  How is this diagnosed? This condition is diagnosed based on a medical history and physical exam. You may also have tests, including:  Blood tests.  Lab tests.  Imaging tests.  How is this treated? Treatment for this condition may include:  Medicines, such as antibiotic medicines or antihistamines.  Supportive care, such as rest and application of cold or warm cloths (cold or warm compresses) to the skin.  Hospital care, if the condition is severe.  The infection usually gets better within 1-2 days of treatment. Follow these instructions at home:  Take over-the-counter and prescription medicines only as told by your  health care provider.  If you were prescribed an antibiotic medicine, take it as told by your health care provider. Do not stop taking the antibiotic even if you start to feel better.  Drink enough fluid to keep your urine clear or pale yellow.  Do not touch or rub the infected area.  Raise (elevate) the infected area above the level of your heart while you are sitting or lying down.  Apply warm or cold compresses to the affected area as told by your health care provider.  Keep all follow-up visits as told by your health care provider. This is important. These visits let your health care provider make sure a more serious infection is not developing. Contact a health care provider if:  You have a fever.  Your symptoms do not improve within 1-2 days of starting treatment.  Your bone or joint underneath the infected area becomes painful after the skin has healed.  Your infection returns in the same area or another area.  You notice a swollen bump in the infected area.  You develop new symptoms.  You have a general ill feeling (malaise) with muscle aches and pains. Get help right away if:  Your symptoms get worse.  You feel very sleepy.  You develop vomiting or diarrhea that persists.  You notice red streaks coming from the infected area.  Your red area gets larger or turns dark in color. This information is not intended to replace advice given to you by your health care provider. Make sure you discuss any  questions you have with your health care provider. Document Released: 08/20/2005 Document Revised: 03/20/2016 Document Reviewed: 09/19/2015 Elsevier Interactive Patient Education  2017 [employer].

## 2017-05-26 NOTE — ED Provider Notes (Signed)
[facility]    CSN: [id number] Arrival date & time: 05/26/17  1214     History   Chief Complaint Chief Complaint  Patient presents with  . Fever  . Elbow Pain    HPI Christopher Phelps is a 56 y.o. male.   Patient complains of increasing pain and swelling in his left elbow.  He had been treated here four days ago for olecranon bursitis with doxycycline 100mg  BID.  He began developing low grade fever (100 to 100.5) about 5 days ago, and his fever has persisted despite taking doxycycline. He had also noted that he had a tick bite about two months ago, but did not have any rash or systemic symptoms until he developed left elbow pain recently.   The history is provided by the patient and the spouse.    Past Medical History:  Diagnosis Date  . Back pain   . Hematochezia   . History of anal fissures     Patient Active Problem List   Diagnosis Date Noted  . BACK PAIN, LUMBAR 07/05/2010  . SACROILIAC STRAIN 07/05/2010  . NEPHROLITHIASIS, HX OF 07/05/2010    Past Surgical History:  Procedure Laterality Date  . COLONOSCOPY    . kidney stones  2009   2 times       Home Medications    Prior to Admission medications   Medication Sig Start Date End Date Taking? Authorizing Provider  cefdinir (OMNICEF) 300 MG capsule Take 1 capsule (300 mg total) by mouth 2 (two) times daily. 05/26/17   Rodolph Bong, MD  doxycycline (VIBRAMYCIN) 100 MG capsule Take 1 capsule (100 mg total) by mouth 2 (two) times daily. One po bid x 7 days 05/22/17   Lurene Shadow, PA-C    Family History Family History  Problem Relation Age of Onset  . Heart disease Father   . Prostate cancer Father   . Heart disease Mother     Social History Social History  Substance Use Topics  . Smoking status: Never Smoker  . Smokeless tobacco: Never Used  . Alcohol use 1.8 oz/week    3 Shots of liquor per week     Allergies   Penicillins   Review of Systems Review of Systems    Constitutional: Positive for activity change, chills, diaphoresis, fatigue and fever. Negative for appetite change.  HENT: Negative.   Eyes: Negative.   Respiratory: Negative.   Cardiovascular: Negative.   Gastrointestinal: Negative.   Genitourinary: Negative.   Musculoskeletal:       Left elbow pain and swelling.  Skin: Positive for color change.  Neurological: Negative.      Physical Exam Triage Vital Signs ED Triage Vitals  Enc Vitals Group     BP 05/26/17 1238 119/83     Pulse Rate 05/26/17 1238 76     Resp 05/26/17 1238 16     Temp 05/26/17 1238 98.3 F (36.8 C)     Temp Source 05/26/17 1238 Oral     SpO2 05/26/17 1238 98 %     Weight 05/26/17 1240 239 lb (108.4 kg)     Height 05/26/17 1240 5\' 10"  (1.778 m)     Head Circumference --      Peak Flow --      Pain Score 05/26/17 1240 4     Pain Loc --      Pain Edu? --      Excl. in GC? --    No data found.  Updated Vital Signs BP 119/83 (BP Location: Left Arm)   Pulse 76   Temp 98.3 F (36.8 C) (Oral)   Resp 16   Ht 5\' 10"  (1.778 m)   Wt 239 lb (108.4 kg)   SpO2 98%   BMI 34.29 kg/m   Visual Acuity Right Eye Distance:   Left Eye Distance:   Bilateral Distance:    Right Eye Near:   Left Eye Near:    Bilateral Near:     Physical Exam  Constitutional: He appears well-developed and well-nourished. No distress.  HENT:  Head: Normocephalic.  Eyes: EOM are normal. Pupils are equal, round, and reactive to light.  Neck: Neck supple.  Cardiovascular: Normal heart sounds.   Pulmonary/Chest: Breath sounds normal.  Musculoskeletal: He exhibits no edema.       Right elbow: He exhibits decreased range of motion and swelling. Tenderness found. Olecranon process tenderness noted.       Arms: Left elbow has tenderness to palpation and warmth over the olecranon bursa.  He has difficulty fully extending the elbow.  Distal neurovascular function is intact.   Lymphadenopathy:    He has no cervical adenopathy.   Neurological: He is alert.  Skin: Skin is warm and dry.  Nursing note and vitals reviewed.    UC Treatments / Results  Labs (all labs ordered are listed, but only abnormal results are displayed) Labs Reviewed  ROCKY MTN SPOTTED FVR ABS PNL(IGG+IGM)  B. BURGDORFI ANTIBODIES  SEDIMENTATION RATE  POCT CBC W AUTO DIFF ([facility]):  WBC 7.4; LY 25.1; MO 3.7; GR 71.2; Hgb 14.6; Platelets 280     EKG  EKG Interpretation None       Radiology No results found.  Procedures Procedures (including critical care time)  Medications Ordered in UC Medications  cefTRIAXone (ROCEPHIN) injection 1 g (1 g Intramuscular Given 05/26/17 1412)     Initial Impression / Assessment and Plan / UC Course  I have reviewed the triage vital signs and the nursing notes.  Pertinent labs & imaging results that were available during my care of the patient were reviewed by me and considered in my medical decision making (see chart for details).    Normal WBC reassuring. Will refer to Dr. Denyse Amassorey for further evaluation of left elbow bursitis vs cellulitis Follow instructions as per Dr. Denyse Amassorey. At patient's request, will check RMSF and Lyme disease titers.    Final Clinical Impressions(s) / UC Diagnoses   Final diagnoses:  Cellulitis of left elbow    New Prescriptions New Prescriptions   CEFDINIR (OMNICEF) 300 MG CAPSULE    Take 1 capsule (300 mg total) by mouth 2 (two) times daily.     Lattie HawBeese, Majed Pellegrin A, MD 05/26/17 1640

## 2017-05-26 NOTE — ED Notes (Signed)
Pt denied any reaction to Rocephin. Clemens Catholichristy Taino Maertens, LPN

## 2017-05-26 NOTE — Discharge Instructions (Signed)
Follow instructions as per Dr. Corey. °

## 2017-05-26 NOTE — Progress Notes (Signed)
   Subjective:    I'm seeing this patient as a consultation for:  Dr Cathren HarshBeese  CC: Left Elbow Pain  HPI: Mr Christopher Phelps Has developed left elbow pain and swelling over the past several days. He cannot recall any injury but notes that he did scrape his elbow month ago. He was seen in urgent care on June 29 grade was thought to have olecranon bursitis potentially versus cellulitis. He was started on doxycycline. He notes in the interim he has worsened developing worsening elbow and forearm swelling redness and tenderness. He also notes new onset of fevers and chills with a fever of 102 at home measured today. He denies vomiting or diarrhea.  Past medical history, Surgical history, Family history not pertinant except as noted below, Social history, Allergies, and medications have been entered into the medical record, reviewed, and no changes needed.   Review of Systems: No headache, visual changes, nausea, vomiting, diarrhea, constipation, dizziness, abdominal pain, skin rash, fevers, chills, night sweats, weight loss, swollen lymph nodes, body aches, joint swelling, muscle aches, chest pain, shortness of breath, mood changes, visual or auditory hallucinations.   Objective:   BP 119/83 (BP Location: Left Arm)   Pulse 76   Temp 98.3 F (36.8 C) (Oral)   Resp 16   Ht 5\' 10"  (1.778 m)   Wt 239 lb (108.4 kg)   SpO2 98%   BMI 34.29 kg/m  General: Well Developed, well nourished, and in no acute distress.  Neuro/Psych: Alert and oriented x3, extra-ocular muscles intact, able to move all 4 extremities, sensation grossly intact. Skin: Warm and dry, no rashes noted.  Respiratory: Not using accessory muscles, speaking in full sentences, trachea midline.  Cardiovascular: Pulses palpable, no extremity edema. Abdomen: Does not appear distended. MSK:  Left forearm and elbow is swollen at the extensor elbow. Erythematous with some induration and tenderness. No fluctuance palpated. No expressible pus. Some scaly  scabs are present at the extensor elbow. Elbow motion is intact and full.  Limited musculoskeletal ultrasound of the left extensor elbow reveals normal bony structures. There is significant soft tissue swelling and hypoechoic change infiltrating into the soft tissue consistent with cellulitis. There are no definitive hypoechoic fluid pockets indicating olecranon bursitis or abscess.  CBC from urgent care shows a white blood cell count less than 8  Impression and Recommendations:    Assessment and Plan: 56 y.o. male with  Left elbow pain and swelling. I do not see clear evidence of olecranon bursitis on ultrasound.  I'm highly suspicious for cellulitis based on the results of the ultrasound. I think at this point trying a needle aspiration of structures deep to the skin would likely create septic olecranon bursitis if there is not already one there.   Plan to treat with 1g IM ceftriaxone now and oral omnicef.  Follow up in 2 days.    No orders of the defined types were placed in this encounter.  Meds ordered this encounter  Medications  . cefdinir (OMNICEF) 300 MG capsule    Sig: Take 1 capsule (300 mg total) by mouth 2 (two) times daily.    Dispense:  14 capsule    Refill:  0    Discussed warning signs or symptoms. Please see discharge instructions. Patient expresses understanding.

## 2017-05-28 ENCOUNTER — Ambulatory Visit (INDEPENDENT_AMBULATORY_CARE_PROVIDER_SITE_OTHER): Payer: Managed Care, Other (non HMO) | Admitting: Family Medicine

## 2017-05-28 ENCOUNTER — Ambulatory Visit (HOSPITAL_BASED_OUTPATIENT_CLINIC_OR_DEPARTMENT_OTHER): Payer: Managed Care, Other (non HMO) | Admitting: Anesthesiology

## 2017-05-28 ENCOUNTER — Encounter (HOSPITAL_BASED_OUTPATIENT_CLINIC_OR_DEPARTMENT_OTHER): Payer: Self-pay | Admitting: *Deleted

## 2017-05-28 ENCOUNTER — Ambulatory Visit (HOSPITAL_BASED_OUTPATIENT_CLINIC_OR_DEPARTMENT_OTHER)
Admission: RE | Admit: 2017-05-28 | Discharge: 2017-05-28 | Disposition: A | Discharge status: Home / Self Care | Payer: Managed Care, Other (non HMO) | Source: Ambulatory Visit | Attending: Orthopedic Surgery | Admitting: Orthopedic Surgery

## 2017-05-28 ENCOUNTER — Encounter (HOSPITAL_BASED_OUTPATIENT_CLINIC_OR_DEPARTMENT_OTHER)
Admission: RE | Disposition: A | Discharge status: Home / Self Care | Payer: Self-pay | Source: Ambulatory Visit | Attending: Orthopedic Surgery

## 2017-05-28 VITALS — BP 130/82 | HR 61 | Temp 98.0°F | Wt 262.0 lb

## 2017-05-28 DIAGNOSIS — Z87442 Personal history of urinary calculi: Secondary | ICD-10-CM | POA: Insufficient documentation

## 2017-05-28 DIAGNOSIS — M7022 Olecranon bursitis, left elbow: Secondary | ICD-10-CM | POA: Insufficient documentation

## 2017-05-28 DIAGNOSIS — M71122 Other infective bursitis, left elbow: Secondary | ICD-10-CM

## 2017-05-28 DIAGNOSIS — Z6837 Body mass index (BMI) 37.0-37.9, adult: Secondary | ICD-10-CM | POA: Insufficient documentation

## 2017-05-28 DIAGNOSIS — Z880 Allergy status to penicillin: Secondary | ICD-10-CM | POA: Diagnosis not present

## 2017-05-28 DIAGNOSIS — E669 Obesity, unspecified: Secondary | ICD-10-CM | POA: Insufficient documentation

## 2017-05-28 DIAGNOSIS — Z8249 Family history of ischemic heart disease and other diseases of the circulatory system: Secondary | ICD-10-CM | POA: Diagnosis not present

## 2017-05-28 HISTORY — PX: INCISION AND DRAINAGE ABSCESS: SHX5864

## 2017-05-28 LAB — ROCKY MTN SPOTTED FVR ABS PNL(IGG+IGM)
RMSF IGM: NOT DETECTED
RMSF IgG: NOT DETECTED

## 2017-05-28 LAB — LYME AB/WESTERN BLOT REFLEX: B burgdorferi Ab IgG+IgM: <0.9 Index (ref <0.90)

## 2017-05-28 SURGERY — INCISION AND DRAINAGE, ABSCESS
Anesthesia: General | Site: Elbow | Laterality: Left

## 2017-05-28 MED ORDER — ONDANSETRON HCL 4 MG/2ML IJ SOLN
INTRAMUSCULAR | Status: AC
  Filled 2017-05-28: qty 2

## 2017-05-28 MED ORDER — HYDROMORPHONE HCL 1 MG/ML IJ SOLN
INTRAMUSCULAR | Status: AC
  Filled 2017-05-28: qty 0.5

## 2017-05-28 MED ORDER — MEPERIDINE HCL 25 MG/ML IJ SOLN: 6.2500 mg | INTRAMUSCULAR | Status: DC | PRN

## 2017-05-28 MED ORDER — POVIDONE-IODINE 10 % EX SWAB: 2.0000 "application " | Freq: Once | CUTANEOUS | Status: DC

## 2017-05-28 MED ORDER — MIDAZOLAM HCL 2 MG/2ML IJ SOLN
1.0000 mg | INTRAMUSCULAR | Status: DC | PRN
  Administered 2017-05-28: 2 mg via INTRAVENOUS

## 2017-05-28 MED ORDER — OXYCODONE HCL 5 MG/5ML PO SOLN: 5.0000 mg | Freq: Once | ORAL | Status: AC | PRN

## 2017-05-28 MED ORDER — KETOROLAC TROMETHAMINE 30 MG/ML IJ SOLN
INTRAMUSCULAR | Status: AC
  Filled 2017-05-28: qty 1

## 2017-05-28 MED ORDER — LIDOCAINE HCL (CARDIAC) 20 MG/ML IV SOLN
INTRAVENOUS | Status: AC
  Filled 2017-05-28: qty 5

## 2017-05-28 MED ORDER — FENTANYL CITRATE (PF) 100 MCG/2ML IJ SOLN
INTRAMUSCULAR | Status: AC
  Filled 2017-05-28: qty 2

## 2017-05-28 MED ORDER — KETOROLAC TROMETHAMINE 30 MG/ML IJ SOLN
INTRAMUSCULAR | Status: DC | PRN
  Administered 2017-05-28: 30 mg via INTRAVENOUS

## 2017-05-28 MED ORDER — SULFAMETHOXAZOLE-TRIMETHOPRIM 800-160 MG PO TABS: 1.0000 | ORAL_TABLET | Freq: Two times a day (BID) | ORAL | 0 refills | Status: DC

## 2017-05-28 MED ORDER — PROMETHAZINE HCL 25 MG/ML IJ SOLN: 6.2500 mg | INTRAMUSCULAR | Status: DC | PRN

## 2017-05-28 MED ORDER — CHLORHEXIDINE GLUCONATE 4 % EX LIQD: 60.0000 mL | Freq: Once | CUTANEOUS | Status: DC

## 2017-05-28 MED ORDER — LIDOCAINE 2% (20 MG/ML) 5 ML SYRINGE
INTRAMUSCULAR | Status: DC | PRN
  Administered 2017-05-28: 60 mg via INTRAVENOUS

## 2017-05-28 MED ORDER — LACTATED RINGERS IV SOLN
INTRAVENOUS | Status: DC
  Administered 2017-05-28: 12:00:00 via INTRAVENOUS

## 2017-05-28 MED ORDER — OXYCODONE HCL 5 MG PO TABS
5.0000 mg | ORAL_TABLET | Freq: Once | ORAL | Status: AC | PRN
  Administered 2017-05-28: 5 mg via ORAL

## 2017-05-28 MED ORDER — OXYCODONE HCL 5 MG PO TABS
ORAL_TABLET | ORAL | Status: AC
  Filled 2017-05-28: qty 1

## 2017-05-28 MED ORDER — ONDANSETRON HCL 4 MG/2ML IJ SOLN
INTRAMUSCULAR | Status: DC | PRN
  Administered 2017-05-28: 4 mg via INTRAVENOUS

## 2017-05-28 MED ORDER — BUPIVACAINE HCL (PF) 0.25 % IJ SOLN
INTRAMUSCULAR | Status: DC | PRN
  Administered 2017-05-28: 7 mL

## 2017-05-28 MED ORDER — PROPOFOL 10 MG/ML IV BOLUS
INTRAVENOUS | Status: DC | PRN
Start: 2017-05-28 — End: 2017-05-28
  Administered 2017-05-28: 200 mg via INTRAVENOUS

## 2017-05-28 MED ORDER — DEXAMETHASONE SODIUM PHOSPHATE 10 MG/ML IJ SOLN
INTRAMUSCULAR | Status: AC
  Filled 2017-05-28: qty 1

## 2017-05-28 MED ORDER — VANCOMYCIN HCL IN DEXTROSE 1-5 GM/200ML-% IV SOLN
INTRAVENOUS | Status: AC
  Filled 2017-05-28: qty 200

## 2017-05-28 MED ORDER — BUPIVACAINE HCL (PF) 0.25 % IJ SOLN
INTRAMUSCULAR | Status: AC
  Filled 2017-05-28: qty 30

## 2017-05-28 MED ORDER — DEXAMETHASONE SODIUM PHOSPHATE 10 MG/ML IJ SOLN
INTRAMUSCULAR | Status: DC | PRN
  Administered 2017-05-28: 10 mg via INTRAVENOUS

## 2017-05-28 MED ORDER — FENTANYL CITRATE (PF) 100 MCG/2ML IJ SOLN
50.0000 ug | INTRAMUSCULAR | Status: DC | PRN
  Administered 2017-05-28: 100 ug via INTRAVENOUS
  Administered 2017-05-28: 50 ug via INTRAVENOUS

## 2017-05-28 MED ORDER — HYDROMORPHONE HCL-NACL 0.5-0.9 MG/ML-% IV SOSY
0.2500 mg | PREFILLED_SYRINGE | INTRAVENOUS | Status: DC | PRN
  Administered 2017-05-28 (×2): 0.5 mg via INTRAVENOUS

## 2017-05-28 MED ORDER — SCOPOLAMINE 1 MG/3DAYS TD PT72: 1.0000 | MEDICATED_PATCH | Freq: Once | TRANSDERMAL | Status: DC | PRN

## 2017-05-28 MED ORDER — MIDAZOLAM HCL 2 MG/2ML IJ SOLN
INTRAMUSCULAR | Status: AC
  Filled 2017-05-28: qty 2

## 2017-05-28 MED ORDER — HYDROCODONE-ACETAMINOPHEN 5-325 MG PO TABS: ORAL_TABLET | ORAL | 0 refills | Status: DC

## 2017-05-28 MED ORDER — VANCOMYCIN HCL 1000 MG IV SOLR
INTRAVENOUS | Status: DC | PRN
  Administered 2017-05-28: 1000 mg via INTRAVENOUS

## 2017-05-28 SURGICAL SUPPLY — 49 items
BAG DECANTER FOR FLEXI CONT (MISCELLANEOUS) IMPLANT
BANDAGE ACE 3X5.8 VEL STRL LF (GAUZE/BANDAGES/DRESSINGS) IMPLANT
BANDAGE ACE 4X5 VEL STRL LF (GAUZE/BANDAGES/DRESSINGS) ×2 IMPLANT
BLADE MINI RND TIP GREEN BEAV (BLADE) IMPLANT
BLADE SURG 15 STRL LF DISP TIS (BLADE) ×2 IMPLANT
BLADE SURG 15 STRL SS (BLADE) ×6
BNDG CMPR 9X4 STRL LF SNTH (GAUZE/BANDAGES/DRESSINGS)
BNDG COHESIVE 1X5 TAN STRL LF (GAUZE/BANDAGES/DRESSINGS) IMPLANT
BNDG ELASTIC 2X5.8 VLCR STR LF (GAUZE/BANDAGES/DRESSINGS) IMPLANT
BNDG ESMARK 4X9 LF (GAUZE/BANDAGES/DRESSINGS) IMPLANT
BNDG GAUZE 1X2.1 STRL (MISCELLANEOUS) IMPLANT
BNDG GAUZE ELAST 4 BULKY (GAUZE/BANDAGES/DRESSINGS) ×2 IMPLANT
CHLORAPREP W/TINT 26ML (MISCELLANEOUS) ×3 IMPLANT
CORD BIPOLAR FORCEPS 12FT (ELECTRODE) ×3 IMPLANT
COVER BACK TABLE 60X90IN (DRAPES) ×3 IMPLANT
COVER MAYO STAND STRL (DRAPES) ×3 IMPLANT
CUFF TOURNIQUET SINGLE 18IN (TOURNIQUET CUFF) ×3 IMPLANT
DRAPE EXTREMITY T 121X128X90 (DRAPE) ×3 IMPLANT
DRAPE SURG 17X23 STRL (DRAPES) ×3 IMPLANT
GAUZE IODOFORM PACK 1/2 7832 (GAUZE/BANDAGES/DRESSINGS) ×2 IMPLANT
GAUZE PACKING IODOFORM 1/4X15 (GAUZE/BANDAGES/DRESSINGS) IMPLANT
GAUZE SPONGE 4X4 12PLY STRL (GAUZE/BANDAGES/DRESSINGS) ×3 IMPLANT
GAUZE XEROFORM 1X8 LF (GAUZE/BANDAGES/DRESSINGS) ×3 IMPLANT
GLOVE BIO SURGEON STRL SZ7.5 (GLOVE) ×3 IMPLANT
GLOVE BIOGEL PI IND STRL 8 (GLOVE) ×1 IMPLANT
GLOVE BIOGEL PI INDICATOR 8 (GLOVE) ×2
GOWN STRL REUS W/ TWL LRG LVL3 (GOWN DISPOSABLE) ×1 IMPLANT
GOWN STRL REUS W/TWL LRG LVL3 (GOWN DISPOSABLE) ×3
GOWN STRL REUS W/TWL XL LVL3 (GOWN DISPOSABLE) ×3 IMPLANT
LOOP VESSEL MAXI BLUE (MISCELLANEOUS) IMPLANT
NDL HYPO 25X1 1.5 SAFETY (NEEDLE) IMPLANT
NEEDLE HYPO 25X1 1.5 SAFETY (NEEDLE) IMPLANT
NS IRRIG 1000ML POUR BTL (IV SOLUTION) ×3 IMPLANT
PACK BASIN DAY SURGERY FS (CUSTOM PROCEDURE TRAY) ×3 IMPLANT
PAD CAST 3X4 CTTN HI CHSV (CAST SUPPLIES) IMPLANT
PADDING CAST ABS 4INX4YD NS (CAST SUPPLIES) ×2
PADDING CAST ABS COTTON 4X4 ST (CAST SUPPLIES) ×1 IMPLANT
PADDING CAST COTTON 3X4 STRL (CAST SUPPLIES)
SPLINT PLASTER CAST XFAST 3X15 (CAST SUPPLIES) IMPLANT
SPLINT PLASTER XTRA FASTSET 3X (CAST SUPPLIES)
STOCKINETTE 4X48 STRL (DRAPES) ×3 IMPLANT
SUT ETHILON 4 0 PS 2 18 (SUTURE) IMPLANT
SWAB COLLECTION DEVICE MRSA (MISCELLANEOUS) ×2 IMPLANT
SWAB CULTURE ESWAB REG 1ML (MISCELLANEOUS) ×2 IMPLANT
SYR BULB 3OZ (MISCELLANEOUS) ×3 IMPLANT
SYR CONTROL 10ML LL (SYRINGE) IMPLANT
TOWEL OR 17X24 6PK STRL BLUE (TOWEL DISPOSABLE) ×6 IMPLANT
TUBE FEEDING ENTERAL 5FR 16IN (TUBING) IMPLANT
UNDERPAD 30X30 (UNDERPADS AND DIAPERS) ×3 IMPLANT

## 2017-05-28 NOTE — Op Note (Signed)
NAME:  Christopher Phelps, Christopher Phelps NO.:  [id number]  MEDICAL RECORD NO.:  [id number]  LOCATION:                                 FACILITY:  PHYSICIAN:  Betha Loa, MD             DATE OF BIRTH:  DATE OF PROCEDURE:  05/28/2017 DATE OF DISCHARGE:                              OPERATIVE REPORT   PREOPERATIVE DIAGNOSIS:  Left olecranon infected bursitis.  POSTOPERATIVE DIAGNOSIS:  Left olecranon infected bursitis.  PROCEDURE:  Incision and drainage of left olecranon bursa.  SURGEON:  Betha Loa, MD.  ASSISTANT:  None.  ANESTHESIA:  General.  IV FLUIDS:  Per anesthesia flow sheet.  ESTIMATED BLOOD LOSS:  Minimal.  COMPLICATIONS:  None.  SPECIMENS:  Cultures to Micro.  TOURNIQUET TIME:  16 minutes.  DISPOSITION:  Stable to PACU.  INDICATIONS:  Mr. Cosman is a 56 year old right-hand dominant male, who has been having progressive swelling, pain, and erythema of the left elbow over the past 5 days.  He has seen at Urgent Care and has been on both doxycycline and a dose of Rocephin.  He has had some fevers and chills.  I recommended incision and drainage in the operating room. Risks, benefits, and alternatives of the surgery were discussed including the risk of blood loss; infection; damage to nerves, vessels, tendons, ligaments, bone; failure of surgery; need for additional surgery; complications with wound healing; continued pain; continued infection; need for repeat irrigation and debridement.  He voiced understanding of these risks and elected to proceed.  OPERATIVE COURSE:  After being identified preoperatively by myself, the patient and I agreed upon procedure and site of procedure.  Surgical site was marked.  The risks, benefits, and alternatives of surgery were reviewed and he wished to proceed.  Surgical consent had been signed. Antibiotics were held for cultures.  He was transferred to the operating room and placed on the operating room table in supine  position with left upper extremity on arm board.  General anesthesia was induced by anesthesiologist.  The left upper extremity was prepped and draped in normal sterile orthopedic fashion.  Surgical pause was performed between surgeons, Anesthesia, and operating staff, and all were in agreement as to the patient, procedure, and site of procedure.  Tourniquet at the proximal aspect of the extremity was inflated to 250 mmHg after exsanguination of the limb with an Esmarch bandage.  Incision was made at the posterior aspect of the left elbow.  This was carried into subcutaneous tissues by spreading technique.  Thin watery fluid was encountered.  No gross purulence.  Cultures were taken for aerobes and anaerobes.  The bursa was entered.  There was no gross purulence within the bursa.  The area was debrided lightly with the rongeurs.  Care was taken to stay away from the ulnar nerve.  Bipolar electrocautery was used to obtain hemostasis.  There was some devitalized tissue removed from within the bursa with the rongeurs.  The Ray-Tec sponge was also used.  The wound was copiously irrigated with sterile saline.  It was then packed with 0.5-inch iodoform gauze.  It was dressed.  The area was injected  with 7 mL of 0.25% plain Marcaine to aid in postoperative analgesia.  It was then dressed with sterile 4x4s and ABD and wrapped with a Kerlix bandage.  A posterior splint was placed and wrapped with Ace bandage.  Tourniquet was deflated at 16 minutes.  Fingertips were pink with brisk capillary refill after deflation of tourniquet. Operative drapes were broken down and the patient was awoken from anesthesia safely.  He was transferred back to stretcher and taken to PACU in stable condition.  I will see him back in the office in approximately 4 days for postoperative followup.  I will give him Norco 5/325 one to two p.o. q.6 hours p.r.n. pain, dispensed #20 and Bactrim DS one p.o. b.i.d. x7 days.  We  will also have him start taking over-the- counter anti-inflammatories.     Betha LoaKevin Mitchael Luckey, MD     KK/MEDQ  D:  05/28/2017  T:  05/28/2017  Job:  [id number]

## 2017-05-28 NOTE — Brief Op Note (Signed)
05/28/2017  12:43 PM  PATIENT:  Christopher Phelps  56 y.o. male  PRE-OPERATIVE DIAGNOSIS:  Left elbow infected olecranon bursa  POST-OPERATIVE DIAGNOSIS:  Left elbow infected olecranon bursa  PROCEDURE:  Procedure(s): INCISION AND DRAINAGE ABSCESS (Left)  SURGEON:  Surgeon(s) and Role:    Betha Loa* Alois Colgan, MD - Primary  PHYSICIAN ASSISTANT:   ASSISTANTS: none   ANESTHESIA:   general  EBL:  Total I/O In: 800 [I.V.:800] Out: 5 [Blood:5]  BLOOD ADMINISTERED:none  DRAINS: iodoform packing  LOCAL MEDICATIONS USED:  MARCAINE     SPECIMEN:  Source of Specimen:  left olecranon bursa  DISPOSITION OF SPECIMEN:  micro  COUNTS:  YES  TOURNIQUET:   Total Tourniquet Time Documented: Upper Arm (Left) - 16 minutes Total: Upper Arm (Left) - 16 minutes   DICTATION: .Other Dictation: Dictation Number [id number]  PLAN OF CARE: Discharge to home after PACU  PATIENT DISPOSITION:  PACU - hemodynamically stable.

## 2017-05-28 NOTE — Anesthesia Procedure Notes (Signed)
Procedure Name: LMA Insertion Performed by: Infant Doane W Pre-anesthesia Checklist: Patient identified, Emergency Drugs available, Suction available and Patient being monitored Patient Re-evaluated:Patient Re-evaluated prior to inductionOxygen Delivery Method: Circle system utilized Preoxygenation: Pre-oxygenation with 100% oxygen Intubation Type: IV induction Ventilation: Mask ventilation without difficulty LMA: LMA inserted LMA Size: 5.0 Number of attempts: 1 Placement Confirmation: positive ETCO2 Tube secured with: Tape Dental Injury: Teeth and Oropharynx as per pre-operative assessment        

## 2017-05-28 NOTE — Patient Instructions (Addendum)
Thank you for coming in today. Go directly to the Hand Surgery Office today.   [address], [place], [place] [id number]   Let me know if you have any issues./   No not eat or drink anything.

## 2017-05-28 NOTE — Discharge Instructions (Addendum)

## 2017-05-28 NOTE — Anesthesia Preprocedure Evaluation (Signed)
Anesthesia Evaluation  Patient identified by MRN, date of birth, ID band Patient awake    Reviewed: Allergy & Precautions, NPO status , Patient's Chart, lab work & pertinent test results  Airway Mallampati: II  TM Distance: >3 FB Neck ROM: Limited    Dental no notable dental hx.    Pulmonary neg pulmonary ROS,    Pulmonary exam normal breath sounds clear to auscultation       Cardiovascular negative cardio ROS Normal cardiovascular exam Rhythm:Regular Rate:Normal     Neuro/Psych negative neurological ROS  negative psych ROS   GI/Hepatic negative GI ROS, Neg liver ROS,   Endo/Other  negative endocrine ROS  Renal/GU negative Renal ROS     Musculoskeletal negative musculoskeletal ROS (+)   Abdominal (+) + obese,   Peds  Hematology negative hematology ROS (+)   Anesthesia Other Findings   Reproductive/Obstetrics                             Anesthesia Physical Anesthesia Plan  ASA: II  Anesthesia Plan: General   Post-op Pain Management:    Induction: Intravenous  PONV Risk Score and Plan: 2 and Ondansetron and Dexamethasone  Airway Management Planned: LMA  Additional Equipment:   Intra-op Plan:   Post-operative Plan: Extubation in OR  Informed Consent: I have reviewed the patients History and Physical, chart, labs and discussed the procedure including the risks, benefits and alternatives for the proposed anesthesia with the patient or authorized representative who has indicated his/her understanding and acceptance.   Dental advisory given  Plan Discussed with: CRNA  Anesthesia Plan Comments:         Anesthesia Quick Evaluation

## 2017-05-28 NOTE — Progress Notes (Signed)
Christopher Phelps F Crandell is a 56 y.o. male who presents to [facility] today for follow-up olecranon bursitis/cellulitis. Patient was originally seen in urgent care on June 29 for what was thought to be either cellulitis or olecranon bursitis of the left elbow. He was treated empirically with oral doxycycline. He returned to urgent care on June 3 where I saw him as well. At that time a CBC was obtained which showed a white count cell count less than 8. When I saw him I didn't ultrasound of the soft tissue of the olecranon which did not show an obvious bursa. We treated conservatively with IM ceftriaxone and oral third-generation cephalosporin in the hopes that this was cellulitis. On routine follow-up today he notes that he is not any better continuing to have pain and mild subjective fevers. He denies any vomiting or diarrhea. He last ate yesterday and has not had any water or food this morning.   Past Medical History:  Diagnosis Date  . Back pain   . Hematochezia   . History of anal fissures    Past Surgical History:  Procedure Laterality Date  . COLONOSCOPY    . kidney stones  2009   2 times   Social History  Substance Use Topics  . Smoking status: Never Smoker  . Smokeless tobacco: Never Used  . Alcohol use 1.8 oz/week    3 Shots of liquor per week     ROS:  As above   Medications: Current Outpatient Prescriptions  Medication Sig Dispense Refill  . cefdinir (OMNICEF) 300 MG capsule Take 1 capsule (300 mg total) by mouth 2 (two) times daily. 14 capsule 0  . doxycycline (VIBRAMYCIN) 100 MG capsule Take 1 capsule (100 mg total) by mouth 2 (two) times daily. One po bid x 7 days 28 capsule 0   No current facility-administered medications for this visit.    Allergies  Allergen Reactions  . Penicillins     rash     Exam:  BP 130/82 (BP Location: Right Arm, Patient Position: Sitting, Cuff Size: Large)   Pulse 61   Temp 98 F (36.7 C)  (Oral)   Wt 262 lb (118.8 kg)   SpO2 98%   BMI 37.59 kg/m  General: Well Developed, well nourished, and in no acute distress.  Neuro/Psych: Alert and oriented x3, extra-ocular muscles intact, able to move all 4 extremities, sensation grossly intact. Skin: Warm and dry, no rashes noted.  Respiratory: Not using accessory muscles, speaking in full sentences, trachea midline.  Cardiovascular: Pulses palpable, no extremity edema. Abdomen: Does not appear distended. MSK: Left olecranon is erythematous and tender and swollen.  No induration is present.  Limited musculoskeletal ultrasound shows hypoechoic fluid deep consistent with olecranon bursitis. There is significant hypoechoic fluid infiltration in the soft tissue consistent with cellulitis.    Results for orders placed or performed during the hospital encounter of 05/26/17 (from the past 48 hour(s))  POCT CBC w auto diff     Status: None   Collection Time: 05/26/17  1:08 PM  Result Value Ref Range   WBC  4.5 - 10.5 K/uL    Comment: see scanned report   Lymphocytes relative %  15 - 45 %   Monocytes relative %  2 - 10 %   Neutrophils relative % (GR)  44 - 76 %   Lymphocytes absolute  0.1 - 1.8 K/uL   Monocyes absolute  0.1 - 1 K/uL   Neutrophils absolute (GR#)  1.7 - 7.7 K/uL   RBC  4.2 - 5.8 MIL/uL   Hemoglobin  13 - 17 g/dL   Hematocrit  91.4 - 51 %   MCV  80 - 98 fL   MCH  26.5 - 32.5 pg   MCHC  32.5 - 36.9 g/dL   RDW  78.2 - 14 %   Platelet count  140 - 400 K/uL   MPV  7.8 - 11 fL  Rocky mtn spotted fvr abs pnl(IgG+IgM)     Status: None (Preliminary result)   Collection Time: 05/26/17  1:08 PM  Result Value Ref Range   RMSF IgG  NOT DETECTED   RMSF IgM  NOT DETECTED    Comment: Cross-reactivity between the Spotted Fever and Typhus groups is minor. Significant cross reactivity within the Spotted Fever or Typhus group precludes speciation of the rickettsiae.   Sedimentation rate     Status: Abnormal   Collection Time:  05/26/17  1:08 PM  Result Value Ref Range   Sed Rate 40 (H) 0 - 20 mm/hr  Lyme Ab/Western Blot Reflex     Status: None (Preliminary result)   Collection Time: 05/26/17  1:08 PM  Result Value Ref Range   B burgdorferi Ab IgG+IgM  <0.90 Index   No results found.    Assessment and Plan: 56 y.o. male with  Left elbow cellulitis and likely septic olecranon bursitis. This point patient is failing oral conservative management. Plan refer to hand surgery for consideration of incision and wash out of the operating room.  Return as needed.  Patient will be seen at Dr. Merrilee Seashore office today.     Orders Placed This Encounter  Procedures  . Ambulatory referral to Hand Surgery    Referral Priority:   Routine    Referral Type:   Surgical    Referral Reason:   Specialty Services Required    Requested Specialty:   Hand Surgery    Number of Visits Requested:   1   No orders of the defined types were placed in this encounter.   Discussed warning signs or symptoms. Please see discharge instructions. Patient expresses understanding.

## 2017-05-28 NOTE — H&P (Signed)
Christopher Phelps is an 56 y.o. male.   Chief Complaint: left elbow infection HPI: 56 yo rhd male states he has been having issues with left elbow x 5 days.  Has had progressive swelling, erythema, pain.  Some fevers and chills at night especially.  Feels okay during the day.  Describes an aching pain of 7/10 severity.  Alleviated by nothing, aggravated by palpation.  Has had doxycycline and ceftriaxone without improvement.  Dr. Clovis Riley note from 05/28/2017 reviewed. Xrays viewed and interpreted by me: ap, lateral, oblique views of elbow show no fractures, dislocations, radioopaque foreign bodies. Labs reviewed: (05/26/17) ESR 40, WBC 7.4  Allergies:  Allergies  Allergen Reactions  . Penicillins     rash    Past Medical History:  Diagnosis Date  . Back pain   . Hematochezia   . History of anal fissures     Past Surgical History:  Procedure Laterality Date  . COLONOSCOPY    . kidney stones  2009   2 times    Family History: Family History  Problem Relation Age of Onset  . Heart disease Father   . Prostate cancer Father   . Heart disease Mother     Social History:   reports that he has never smoked. He has never used smokeless tobacco. He reports that he drinks about 1.8 oz of alcohol per week . He reports that he does not use drugs.  Medications: Medications Prior to Admission  Medication Sig Dispense Refill  . cefdinir (OMNICEF) 300 MG capsule Take 1 capsule (300 mg total) by mouth 2 (two) times daily. 14 capsule 0  . doxycycline (VIBRAMYCIN) 100 MG capsule Take 1 capsule (100 mg total) by mouth 2 (two) times daily. One po bid x 7 days 28 capsule 0    Results for orders placed or performed during the hospital encounter of 05/26/17 (from the past 48 hour(s))  POCT CBC w auto diff     Status: None   Collection Time: 05/26/17  1:08 PM  Result Value Ref Range   WBC  4.5 - 10.5 K/uL    Comment: see scanned report   Lymphocytes relative %  15 - 45 %   Monocytes relative %  2 -  10 %   Neutrophils relative % (GR)  44 - 76 %   Lymphocytes absolute  0.1 - 1.8 K/uL   Monocyes absolute  0.1 - 1 K/uL   Neutrophils absolute (GR#)  1.7 - 7.7 K/uL   RBC  4.2 - 5.8 MIL/uL   Hemoglobin  13 - 17 g/dL   Hematocrit  38.5 - 51 %   MCV  80 - 98 fL   MCH  26.5 - 32.5 pg   MCHC  32.5 - 36.9 g/dL   RDW  11.6 - 14 %   Platelet count  140 - 400 K/uL   MPV  7.8 - 11 fL  Rocky mtn spotted fvr abs pnl(IgG+IgM)     Status: None (Preliminary result)   Collection Time: 05/26/17  1:08 PM  Result Value Ref Range   RMSF IgG  NOT DETECTED   RMSF IgM  NOT DETECTED    Comment: Cross-reactivity between the Spotted Fever and Typhus groups is minor. Significant cross reactivity within the Spotted Fever or Typhus group precludes speciation of the rickettsiae.   Sedimentation rate     Status: Abnormal   Collection Time: 05/26/17  1:08 PM  Result Value Ref Range   Sed Rate 40 (H) 0 -  20 mm/hr  Lyme Ab/Western Blot Reflex     Status: None   Collection Time: 05/26/17  1:08 PM  Result Value Ref Range   B burgdorferi Ab IgG+IgM <0.90 <0.90 Index    Comment:                      Index                Interpretation                    -----                --------------                    < 0.90               NEGATIVE                    0.90-1.09            EQUIVOCAL                    > 1.09               POSITIVE   As recommended by the Food and Drug Administration (FDA), all samples with positive or equivocal results on the B. burgdorferi antibody screen will be tested by Western Blot/Immunoblot. Positive or equivocal screening test results should not be interpreted as truly positive until verified as such using a supplemental assay (e.g., B. burgdorferi blot).   The screening test and/or blot for B. burgdorferi antibodies may be falsely negative in early stages of Lyme disease, including the period when erythema migrans is apparent.       No results found.   A comprehensive  review of systems was negative except for: Constitutional: positive for chills and fevers Respiratory: positive for shortness of breath Review of Systems: No chest pain, nausea, vomiting, diarrhea, constipation, easy bleeding or bruising, headaches, dizziness, vision changes, fainting.   Blood pressure 132/86, pulse 65, temperature 97.8 F (36.6 C), temperature source Oral, resp. rate 20, height 5' 10"  (1.778 m), weight 118.8 kg (262 lb), SpO2 99 %.  General appearance: alert, cooperative and appears stated age Head: Normocephalic, without obvious abnormality, atraumatic Neck: supple, symmetrical, trachea midline Resp: clear to auscultation bilaterally Cardio: regular rate and rhythm GI: non-tender Extremities: Intact sensation and capillary refill all digits.  +epl/fpl/io.  Swelling at dorsum of olecranon left elbow with mild erythema.  No proximal streaking.  Tender to palpation.  No open wound.  Can move elbow without pain except at full extents of motion. Pulses: 2+ and symmetric Skin: Skin color, texture, turgor normal. No rashes or lesions Neurologic: Grossly normal Incision/Wound: none  Assessment/Plan Left elbow olecranon bursitis without improvement on oral antibiotics.  Recommend incision and drainage in OR.  Risks, benefits, and alternatives of surgery were discussed and the patient agrees with the plan of care.   Christopher Phelps R 05/28/2017, 11:39 AM

## 2017-05-28 NOTE — Op Note (Signed)
[id number] 

## 2017-05-28 NOTE — Transfer of Care (Signed)
Immediate Anesthesia Transfer of Care Note  Patient: Christopher Phelps  Procedure(s) Performed: Procedure(s): INCISION AND DRAINAGE ABSCESS (Left)  Patient Location: PACU  Anesthesia Type:General  Level of Consciousness: awake and sedated  Airway & Oxygen Therapy: Patient Spontanous Breathing and Patient connected to face mask oxygen  Post-op Assessment: Report given to RN and Post -op Vital signs reviewed and stable  Post vital signs: Reviewed and stable  Last Vitals:  Vitals:   05/28/17 1136 05/28/17 1249  BP: 132/86   Pulse: 65 71  Resp: 20   Temp: 36.6 C     Last Pain:  Vitals:   05/28/17 1136  TempSrc: Oral         Complications: No apparent anesthesia complications

## 2017-05-29 ENCOUNTER — Telehealth: Payer: Self-pay | Admitting: *Deleted

## 2017-05-29 ENCOUNTER — Encounter (HOSPITAL_BASED_OUTPATIENT_CLINIC_OR_DEPARTMENT_OTHER): Payer: Self-pay | Admitting: Orthopedic Surgery

## 2017-05-29 NOTE — Telephone Encounter (Signed)
Patient advised of negative Lyme and RMSF labs. He reports he was referred to ortho from Dr. Denyse Amassorey, he had surgery done on his elbow yesterday. He is home and is doing well.

## 2017-05-29 NOTE — Anesthesia Postprocedure Evaluation (Signed)
Anesthesia Post Note  Patient: Christopher Phelps  Procedure(s) Performed: Procedure(s) (LRB): INCISION AND DRAINAGE ABSCESS (Left)     Patient location during evaluation: PACU Anesthesia Type: General Level of consciousness: sedated and patient cooperative Pain management: pain level controlled Vital Signs Assessment: post-procedure vital signs reviewed and stable Respiratory status: spontaneous breathing Cardiovascular status: stable Anesthetic complications: no    Last Vitals:  Vitals:   05/28/17 1400 05/28/17 1415  BP: 127/80   Pulse: 61   Resp: 11   Temp:  36.5 C    Last Pain:  Vitals:   05/29/17 0955  TempSrc:   PainSc: 3                  Lewie LoronJohn Quamesha Mullet

## 2017-06-02 LAB — AEROBIC/ANAEROBIC CULTURE W GRAM STAIN (SURGICAL/DEEP WOUND)

## 2020-06-03 ENCOUNTER — Other Ambulatory Visit: Payer: Self-pay

## 2020-06-03 ENCOUNTER — Emergency Department (HOSPITAL_COMMUNITY)
Admission: EM | Admit: 2020-06-03 | Discharge: 2020-06-03 | Disposition: A | Discharge status: Home / Self Care | Payer: Managed Care, Other (non HMO) | Attending: Emergency Medicine | Admitting: Emergency Medicine

## 2020-06-03 ENCOUNTER — Emergency Department (HOSPITAL_COMMUNITY): Payer: Managed Care, Other (non HMO)

## 2020-06-03 DIAGNOSIS — N50812 Left testicular pain: Secondary | ICD-10-CM | POA: Insufficient documentation

## 2020-06-03 DIAGNOSIS — M79652 Pain in left thigh: Secondary | ICD-10-CM | POA: Diagnosis not present

## 2020-06-03 DIAGNOSIS — R109 Unspecified abdominal pain: Secondary | ICD-10-CM | POA: Diagnosis not present

## 2020-06-03 DIAGNOSIS — M545 Low back pain, unspecified: Secondary | ICD-10-CM

## 2020-06-03 LAB — BASIC METABOLIC PANEL
Anion gap: 13 (ref 5–15)
BUN: 25 mg/dL — ABNORMAL HIGH (ref 6–20)
CO2: 25 mmol/L (ref 22–32)
Calcium: 9.7 mg/dL (ref 8.9–10.3)
Chloride: 102 mmol/L (ref 98–111)
Creatinine, Ser: 0.86 mg/dL (ref 0.61–1.24)
GFR calc Af Amer: >60 mL/min (ref >60)
GFR calc non Af Amer: >60 mL/min (ref >60)
Glucose, Bld: 97 mg/dL (ref 70–99)
Potassium: 4.5 mmol/L (ref 3.5–5.1)
Sodium: 140 mmol/L (ref 135–145)

## 2020-06-03 LAB — CBC WITH DIFFERENTIAL/PLATELET
Abs Immature Granulocytes: 0.02 10*3/uL (ref 0.00–0.07)
Basophils Absolute: 0 10*3/uL (ref 0.0–0.1)
Basophils Relative: 1 %
Eosinophils Absolute: 0.2 10*3/uL (ref 0.0–0.5)
Eosinophils Relative: 2 %
HCT: 45.9 % (ref 39.0–52.0)
Hemoglobin: 15 g/dL (ref 13.0–17.0)
Immature Granulocytes: 0 %
Lymphocytes Relative: 12 %
Lymphs Abs: 1 10*3/uL (ref 0.7–4.0)
MCH: 29.8 pg (ref 26.0–34.0)
MCHC: 32.7 g/dL (ref 30.0–36.0)
MCV: 91.1 fL (ref 80.0–100.0)
Monocytes Absolute: 0.4 10*3/uL (ref 0.1–1.0)
Monocytes Relative: 5 %
Neutro Abs: 6.4 10*3/uL (ref 1.7–7.7)
Neutrophils Relative %: 80 %
Platelets: 255 10*3/uL (ref 150–400)
RBC: 5.04 MIL/uL (ref 4.22–5.81)
RDW: 13.2 % (ref 11.5–15.5)
WBC: 8 10*3/uL (ref 4.0–10.5)
nRBC: 0 % (ref 0.0–0.2)

## 2020-06-03 LAB — URINALYSIS, ROUTINE W REFLEX MICROSCOPIC
Bilirubin Urine: NEGATIVE
Glucose, UA: NEGATIVE mg/dL
Hgb urine dipstick: NEGATIVE
Ketones, ur: NEGATIVE mg/dL
Leukocytes,Ua: NEGATIVE
Nitrite: NEGATIVE
Protein, ur: NEGATIVE mg/dL
Specific Gravity, Urine: 1.018 (ref 1.005–1.030)
pH: 6 (ref 5.0–8.0)

## 2020-06-03 MED ORDER — METHOCARBAMOL 500 MG PO TABS: 500.0000 mg | ORAL_TABLET | Freq: Two times a day (BID) | ORAL | 0 refills | Status: DC

## 2020-06-03 MED ORDER — HYDROMORPHONE HCL 1 MG/ML IJ SOLN
1.0000 mg | Freq: Once | INTRAMUSCULAR | Status: AC
  Administered 2020-06-03: 1 mg via INTRAVENOUS
  Filled 2020-06-03: qty 1

## 2020-06-03 MED ORDER — OXYCODONE-ACETAMINOPHEN 5-325 MG PO TABS: 1.0000 | ORAL_TABLET | ORAL | 0 refills | Status: DC | PRN

## 2020-06-03 MED ORDER — KETOROLAC TROMETHAMINE 15 MG/ML IJ SOLN
15.0000 mg | Freq: Once | INTRAMUSCULAR | Status: AC
  Administered 2020-06-03: 15 mg via INTRAVENOUS
  Filled 2020-06-03: qty 1

## 2020-06-03 NOTE — ED Notes (Signed)
ED Provider at bedside. 

## 2020-06-03 NOTE — ED Triage Notes (Signed)
Patient reports left flank pain starting at 6 am when he woke up. Patient believed it was kidney stone but now pain has radiated down left leg and causing left groin pain. Pain rated 8/10 in triage. Back pain still present also but groin hurst worse.

## 2020-06-03 NOTE — ED Notes (Signed)
Patient transported to Ultrasound 

## 2020-06-03 NOTE — ED Provider Notes (Signed)
Patient signed to me by Dr. Juleen China pending cystic ultrasound.  The study is negative.  On exam he has no tenderness to palpation.  He is tender at his right lower flank area.  Suspect musculoskeletal etiology.  Will prescribe medications   Lorre Nick, MD 06/03/20 1758

## 2020-06-03 NOTE — ED Provider Notes (Signed)
[facility] Provider Note   CSN: [id number] Arrival date & time: 06/03/20  1056     History Chief Complaint  Patient presents with  . Back Pain    Christopher Phelps is a 59 y.o. male.  HPI   59yM with L lower back pain. Acute onset 0600 today. Feels better when walking around. Sharp and severe. Initially felt like pain he has had with prior kidney stones but the pain has since began radiating to L testicle and L thigh. No urinary complaints. Nausea. No vomiting.   Past Medical History:  Diagnosis Date  . Back pain   . Hematochezia   . History of anal fissures     Patient Active Problem List   Diagnosis Date Noted  . BACK PAIN, LUMBAR 07/05/2010  . SACROILIAC STRAIN 07/05/2010  . NEPHROLITHIASIS, HX OF 07/05/2010    Past Surgical History:  Procedure Laterality Date  . COLONOSCOPY    . INCISION AND DRAINAGE ABSCESS Left 05/28/2017   Procedure: INCISION AND DRAINAGE ABSCESS;  Surgeon: Betha Loa, MD;  Location: [facility];  Service: Orthopedics;  Laterality: Left;  . kidney stones  2009   2 times       Family History  Problem Relation Age of Onset  . Heart disease Father   . Prostate cancer Father   . Heart disease Mother     Social History   Tobacco Use  . Smoking status: Never Smoker  . Smokeless tobacco: Never Used  Substance Use Topics  . Alcohol use: Yes    Alcohol/week: 3.0 standard drinks    Types: 3 Shots of liquor per week  . Drug use: No    Home Medications Prior to Admission medications   Medication Sig Start Date End Date Taking? Authorizing Provider  ALPRAZolam Prudy Feeler) 0.5 MG tablet Take 0.5 mg by mouth at bedtime as needed for anxiety.    [provider]  cetirizine (ZYRTEC) 10 MG tablet Take 10 mg by mouth daily.    [provider]  HYDROcodone-acetaminophen (NORCO) 5-325 MG tablet 1-2 tabs po q6 hours prn pain 05/28/17   Betha Loa, MD  ibuprofen (ADVIL,MOTRIN) 200 MG  tablet Take 400 mg by mouth every 6 (six) hours as needed.    [provider]  sulfamethoxazole-trimethoprim (BACTRIM DS) 800-160 MG tablet Take 1 tablet by mouth 2 (two) times daily. 05/28/17   Betha Loa, MD    Allergies    Penicillins  Review of Systems   Review of Systems All systems reviewed and negative, other than as noted in HPI.  Physical Exam Updated Vital Signs BP (!) 152/96 (BP Location: Right Arm)   Pulse 66   Temp 98.1 F (36.7 C) (Oral)   Resp 18   Ht 5\' 10"  (1.778 m)   Wt 120.2 kg   SpO2 99%   BMI 38.02 kg/m   Physical Exam Vitals and nursing note reviewed.  Constitutional:      General: He is not in acute distress.    Appearance: He is well-developed.  HENT:     Head: Normocephalic and atraumatic.  Eyes:     General:        Right eye: No discharge.        Left eye: No discharge.     Conjunctiva/sclera: Conjunctivae normal.  Cardiovascular:     Rate and Rhythm: Normal rate and regular rhythm.     Heart sounds: Normal heart sounds. No murmur heard.  No friction rub.  No gallop.   Pulmonary:     Effort: Pulmonary effort is normal. No respiratory distress.     Breath sounds: Normal breath sounds.  Abdominal:     General: There is no distension.     Palpations: Abdomen is soft.     Tenderness: There is no abdominal tenderness.  Genitourinary:    Penis: Normal.      Comments: Perhaps some mild tenderness L testicle. Normal lie. No scrotal skin changes. No hernia appreciated.  Musculoskeletal:        General: No tenderness.     Cervical back: Neck supple.  Skin:    General: Skin is warm and dry.  Neurological:     Mental Status: He is alert.  Psychiatric:        Behavior: Behavior normal.        Thought Content: Thought content normal.    ED Results / Procedures / Treatments   Labs (all labs ordered are listed, but only abnormal results are displayed) Labs Reviewed  URINALYSIS, ROUTINE W REFLEX MICROSCOPIC - Abnormal; Notable for the  following components:      Result Value   APPearance HAZY (*)    All other components within normal limits  BASIC METABOLIC PANEL - Abnormal; Notable for the following components:   BUN 25 (*)    All other components within normal limits  CBC WITH DIFFERENTIAL/PLATELET    EKG None  Radiology No results found.   CT Renal Stone Study  Result Date: 06/03/2020 CLINICAL DATA:  Left flank pain since 6 a.m. EXAM: CT ABDOMEN AND PELVIS WITHOUT CONTRAST TECHNIQUE: Multidetector CT imaging of the abdomen and pelvis was performed following the standard protocol without IV contrast. COMPARISON:  CT scan 04/20/2007 FINDINGS: Lower chest: The lung bases are clear of acute process. No pleural effusion or pulmonary lesions. The heart is normal in size. No pericardial effusion. The distal esophagus and aorta are unremarkable. Hepatobiliary: No hepatic lesions or intrahepatic biliary dilatation. The gallbladder is normal. No common bile duct dilatation. Pancreas: No mass, inflammation or ductal dilatation. Spleen: Normal size.  No focal lesions. Adrenals/Urinary Tract: The adrenal glands are normal. Small lower pole left renal calculus but no obstructing ureteral calculi or hydroureteronephrosis. No worrisome renal or bladder lesions are identified. Stomach/Bowel: The stomach, duodenum, small bowel and colon are grossly normal without oral contrast. No inflammatory changes, mass lesions or obstructive findings. The appendix is normal. Minimal scattered diverticulosis but no findings for acute diverticulitis. Vascular/Lymphatic: The aorta is normal in caliber. No atheroscerlotic calcifications. No mesenteric of retroperitoneal mass or adenopathy. Small scattered lymph nodes are noted. Reproductive: The prostate gland and seminal vesicles are unremarkable. Other: No pelvic mass or pelvic adenopathy. No free pelvic fluid collections. Bilateral inguinal hernias containing fat are noted. Musculoskeletal: No significant  bony findings. Moderate degenerative changes noted in the lower lumbar spine. IMPRESSION: 1. Small lower pole left renal calculus but no obstructing ureteral calculi or hydroureteronephrosis. 2. No worrisome renal or bladder lesions without contrast. 3. No acute abdominal/pelvic findings, mass lesions or adenopathy. 4. Bilateral inguinal hernias containing fat. Electronically Signed   By: Rudie Meyer M.D.   On: 06/03/2020 15:27   US SCROTUM W/DOPPLER  Result Date: 06/03/2020 CLINICAL DATA:  Left testicular pain EXAM: SCROTAL ULTRASOUND DOPPLER ULTRASOUND OF THE TESTICLES TECHNIQUE: Complete ultrasound examination of the testicles, epididymis, and other scrotal structures was performed. Color and spectral Doppler ultrasound were also utilized to evaluate blood flow to the testicles. COMPARISON:  CT from June 03, 2020 FINDINGS: Right testicle Measurements: 4.9 x 3.2 x 4.1 cm. No mass or microlithiasis visualized. Left testicle Measurements: 5.2 x 3.1 x 3.4 cm. No mass or microlithiasis visualized. Incidentally noted is a reducible fat containing left inguinal hernia. Right epididymis:  Normal in size and appearance. Left epididymis:  Normal in size and appearance. Hydrocele:  There are trace bilateral hydroceles. Varicocele:  There is a left-sided varicocele. Pulsed Doppler interrogation of both testes demonstrates normal low resistance arterial and venous waveforms bilaterally. IMPRESSION: 1. No acute abnormality.  No evidence for testicular torsion. 2. There is a left-sided varicocele. 3. Incidentally noted is a reducible fat containing left inguinal hernia. The known right inguinal hernia demonstrated on the patient's recent CT is not well appreciated on this study. Electronically Signed   By: Katherine Mantle M.D.   On: 06/03/2020 17:27    Procedures Procedures (including critical care time)  Medications Ordered in ED Medications  ketorolac (TORADOL) 15 MG/ML injection 15 mg (15 mg Intravenous  Given 06/03/20 1402)  HYDROmorphone (DILAUDID) injection 1 mg (1 mg Intravenous Given 06/03/20 1505)    ED Course  I have reviewed the triage vital signs and the nursing notes.  Pertinent labs & imaging results that were available during my care of the patient were reviewed by me and considered in my medical decision making (see chart for details).    MDM Rules/Calculators/A&P                          59yM with L lower back/testicular pain. Renal colic?  Hx of kidney stones. Says feels better with walking around. Abdominal exam benign. Unusual for pain to radiate into thigh though. No blood noted on UA either. Will start with CT. May need scrotal US although clinically this doesn't seem like torsion.    Final Clinical Impression(s) / ED Diagnoses Final diagnoses:  Acute left-sided low back pain without sciatica    Rx / DC Orders ED Discharge Orders    None       Raeford Razor, MD 06/10/20 1217

## 2020-06-03 NOTE — ED Notes (Signed)
Patient transported to CT 

## 2020-06-03 NOTE — ED Notes (Signed)
Pt just finished scrotum ultrasound, now in pain.

## 2021-06-25 IMAGING — US US SCROTUM W/ DOPPLER COMPLETE
1 series · 13 of 25 positions shown · non-contrast
Comparison: CT from June 03, 2020

CLINICAL DATA: Left testicular pain

EXAM:
SCROTAL ULTRASOUND
DOPPLER ULTRASOUND OF THE TESTICLES
TECHNIQUE: Complete ultrasound examination of the testicles, epididymis, and
other scrotal structures was performed. Color and spectral Doppler
ultrasound were also utilized to evaluate blood flow to the
testicles.

[Series 1: us scrotum w/ doppler complete · 60 acquisitions, 13 frames shown]
[im 1/60]
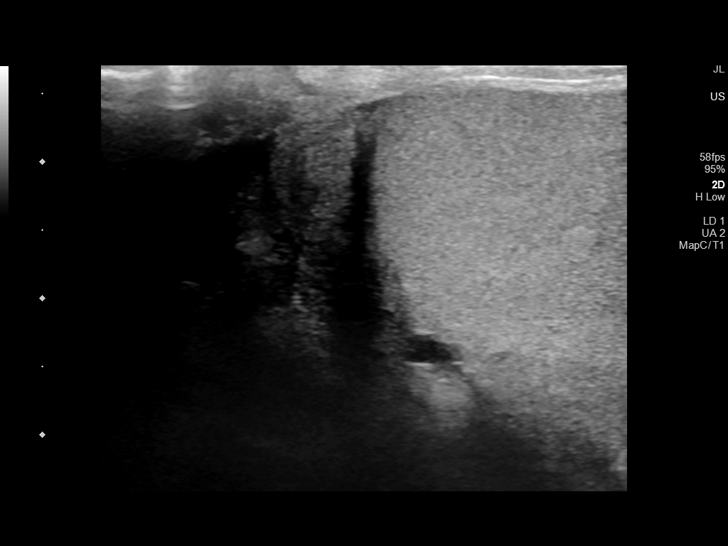
[im 5/60]
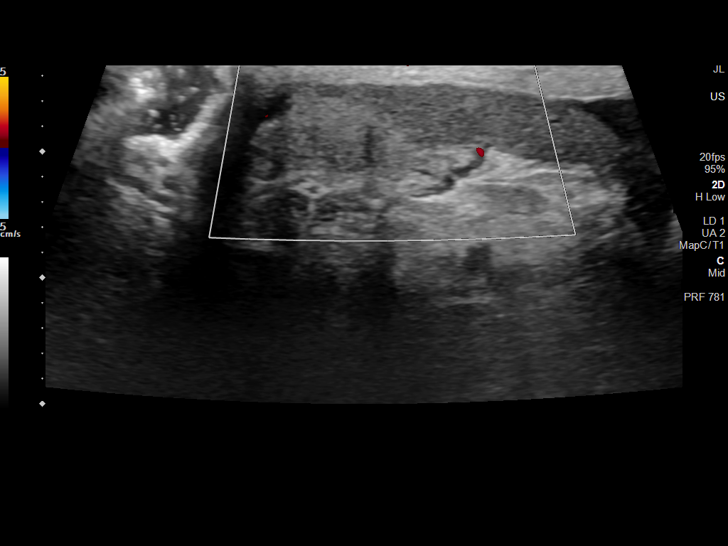
[im 10/60]
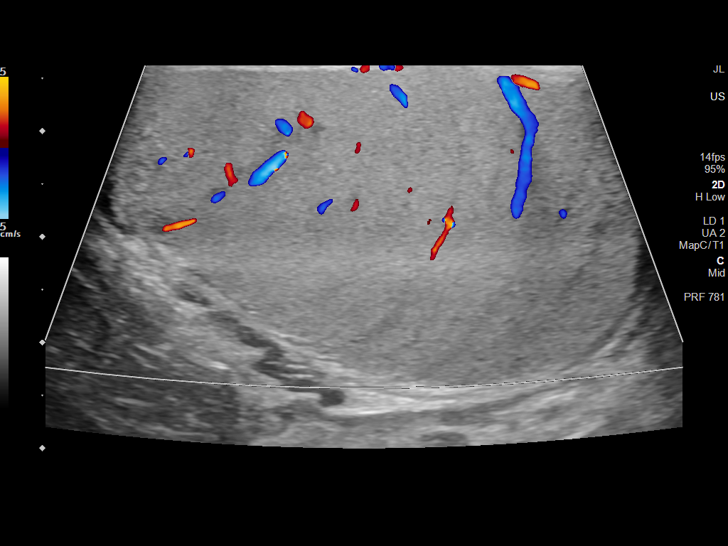
[im 15/60]
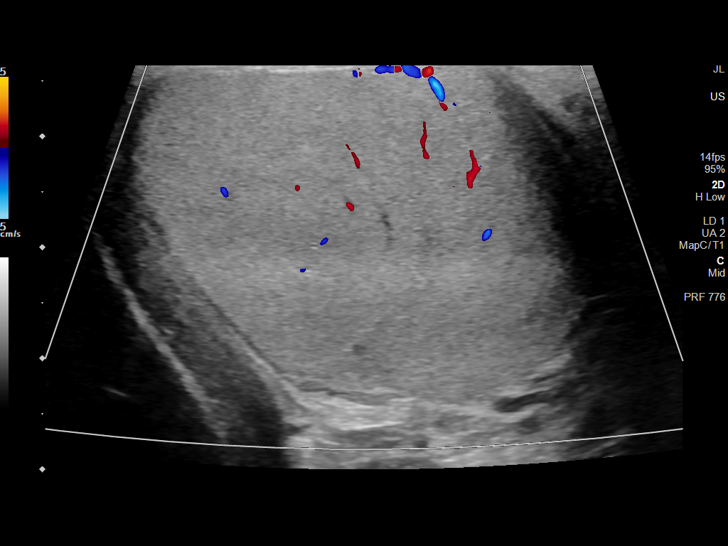
[im 20/60]
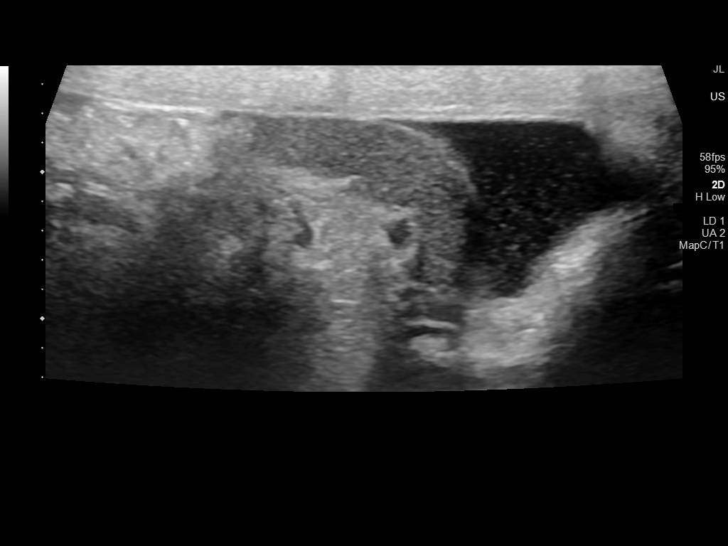
[im 25/60]
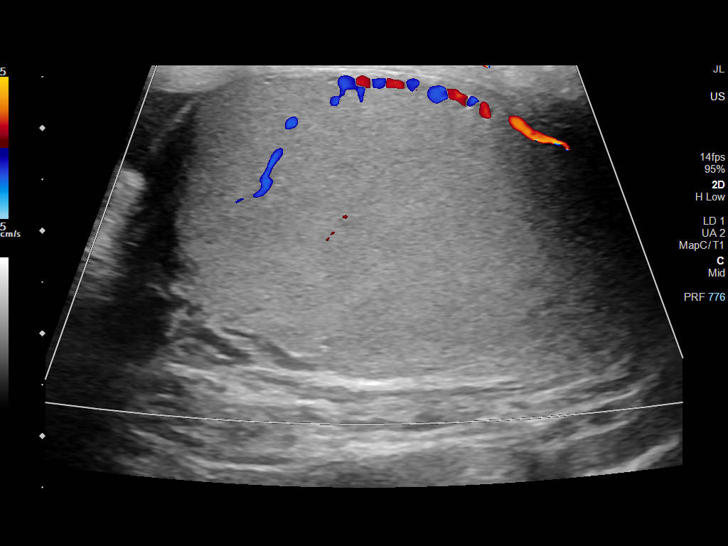
[im 30/60]
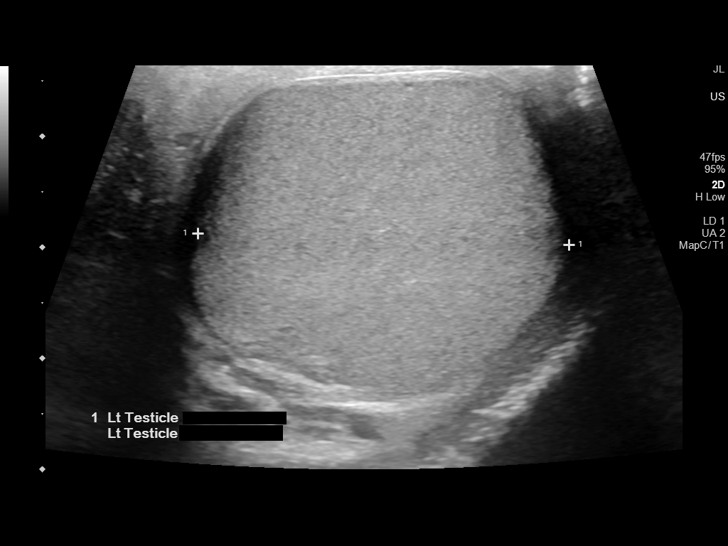
[im 35/60]
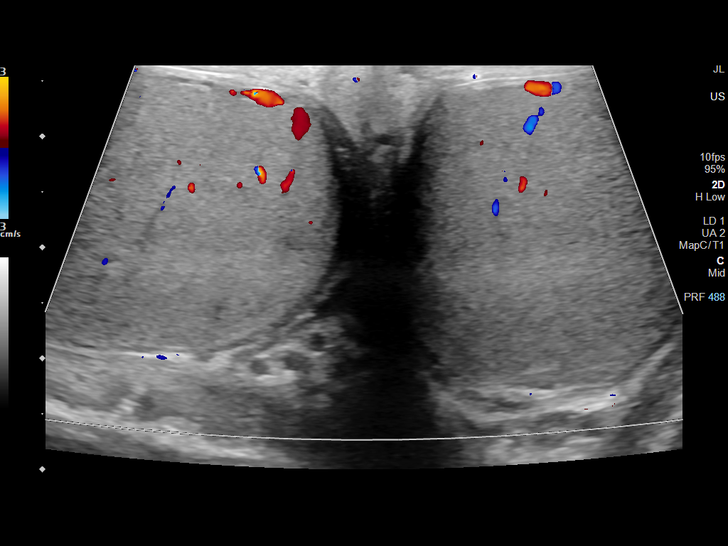
[im 40/60]
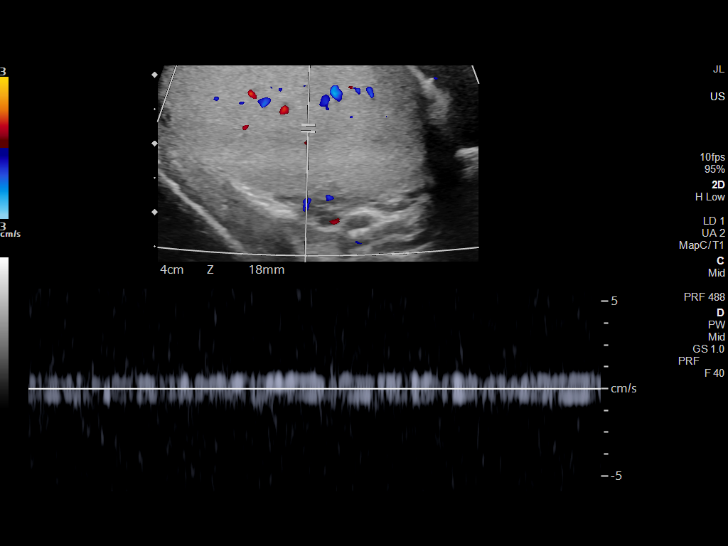
[im 45/60]
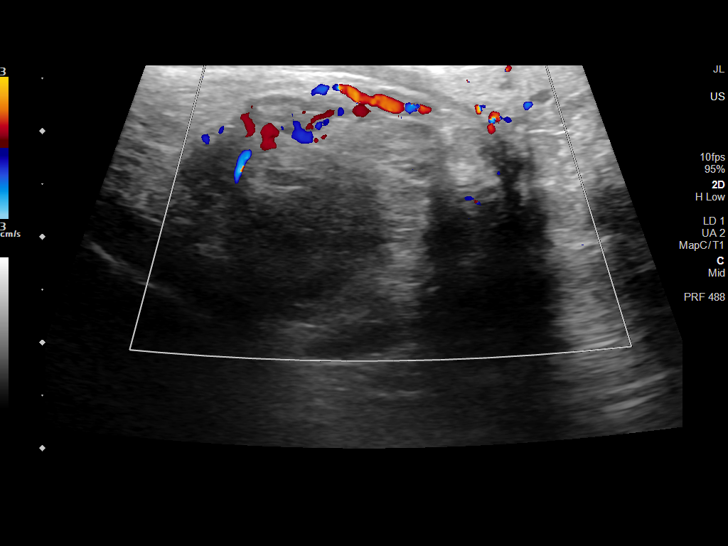
[im 50/60]
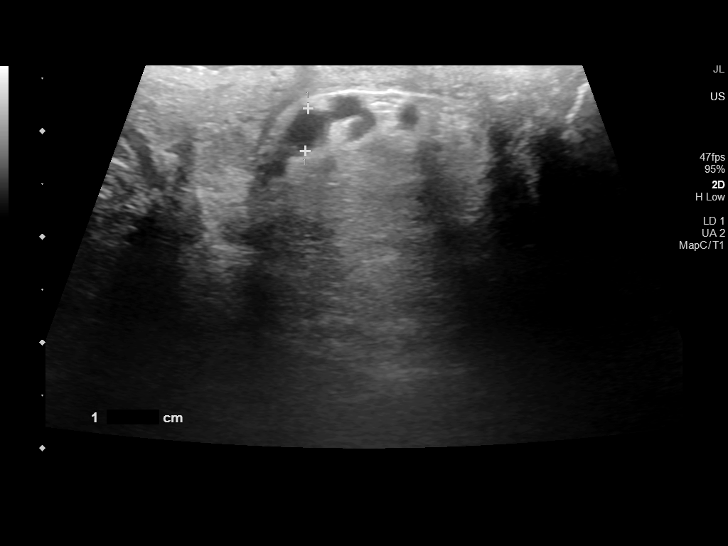
[im 55/60]
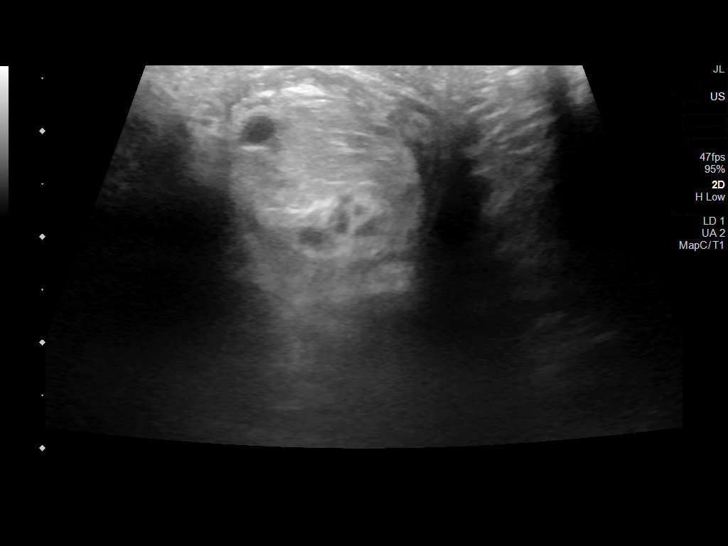
[im 60/60]
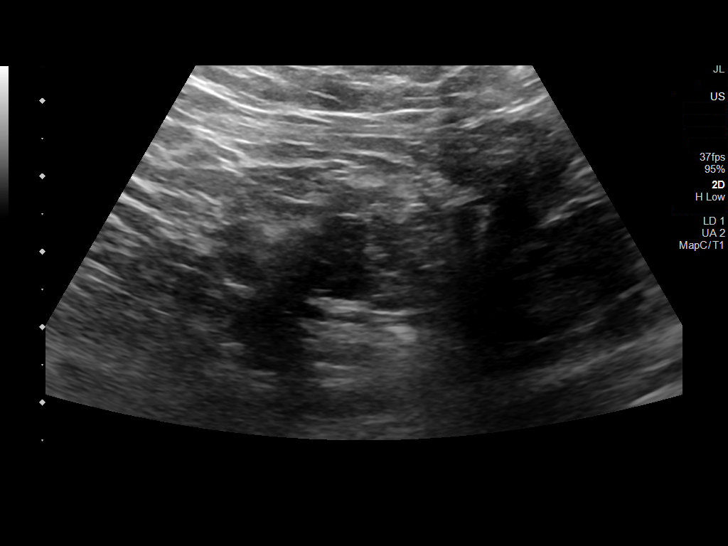

[13 of 25 positions shown; findings below may reference images not displayed]

FINDINGS: Right testicle

Measurements: 4.9 x 3.2 x 4.1 cm. No mass or microlithiasis
visualized.

Left testicle

Measurements: 5.2 x 3.1 x 3.4 cm. No mass or microlithiasis
visualized. Incidentally noted is a reducible fat containing left
inguinal hernia.

Right epididymis:  Normal in size and appearance.

Left epididymis:  Normal in size and appearance.

Hydrocele:  There are trace bilateral hydroceles.

Varicocele:  There is a left-sided varicocele.

Pulsed Doppler interrogation of both testes demonstrates normal low
resistance arterial and venous waveforms bilaterally.
IMPRESSION: 1. No acute abnormality.  No evidence for testicular torsion.
2. There is a left-sided varicocele.
3. Incidentally noted is a reducible fat containing left inguinal
hernia. The known right inguinal hernia demonstrated on the
patient's recent CT is not well appreciated on this study.

## 2021-06-25 IMAGING — CT CT RENAL STONE PROTOCOL
2 of 4 series · 16 of 46 positions shown, 18 images · non-contrast
Comparison: CT scan 04/20/2007

CLINICAL DATA: Left flank pain since 6 a.m.

EXAM:
CT ABDOMEN AND PELVIS WITHOUT CONTRAST
TECHNIQUE: Multidetector CT imaging of the abdomen and pelvis was performed
following the standard protocol without IV contrast.

[Series 2: axial st · axial · 0.96mm/px · z∈[-510,-30]mm · 13 of 108 slices shown, 15 images]
[im 6/108  soft-tissue]
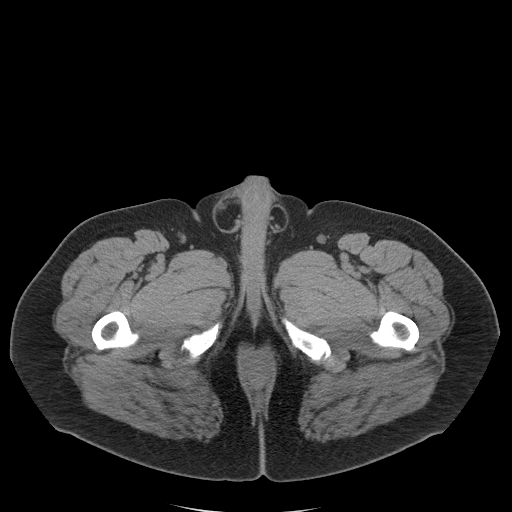
[im 6/108  bone]
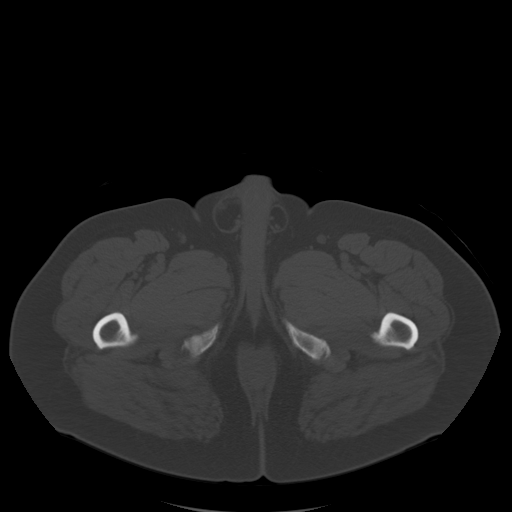
[im 17/108  soft-tissue]
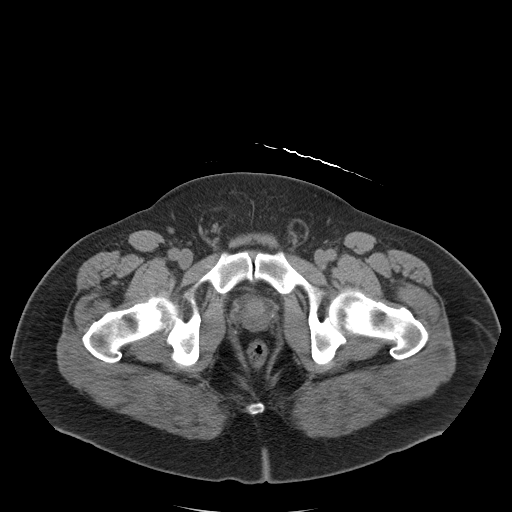
[im 22/108  soft-tissue]
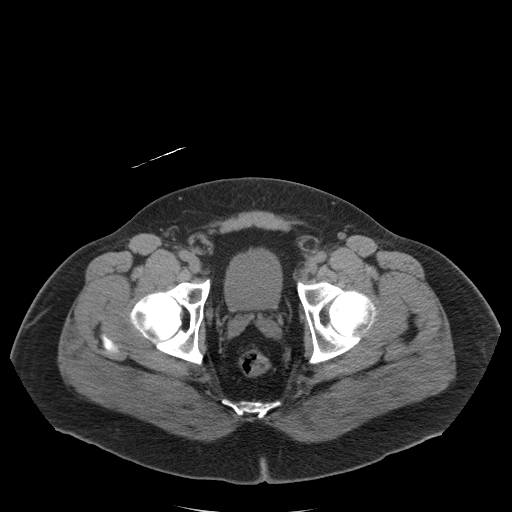
[im 33/108  soft-tissue]
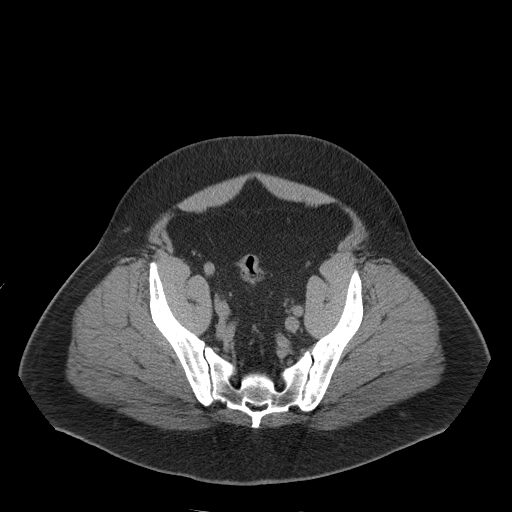
[im 38/108  soft-tissue]
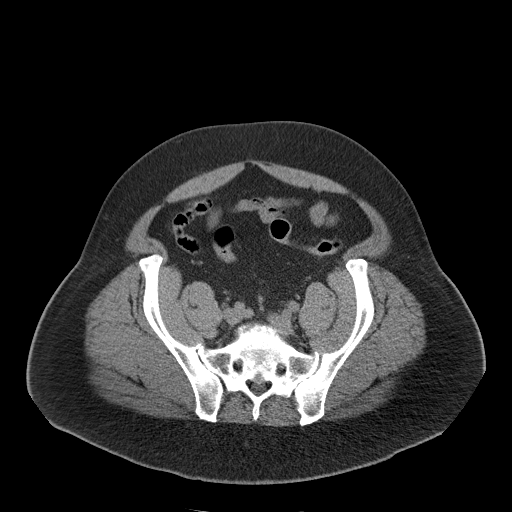
[im 49/108  soft-tissue]
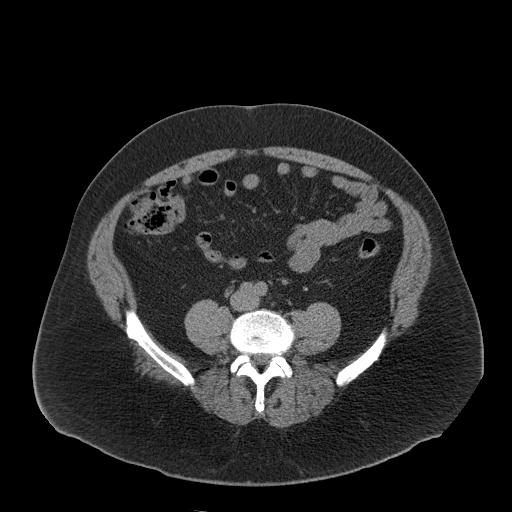
[im 54/108  soft-tissue]
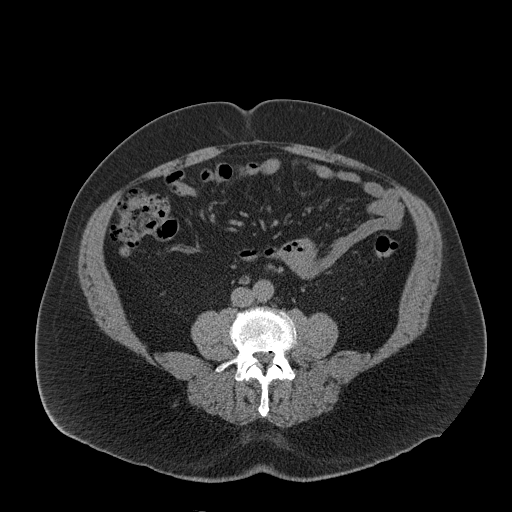
[im 59/108  soft-tissue]
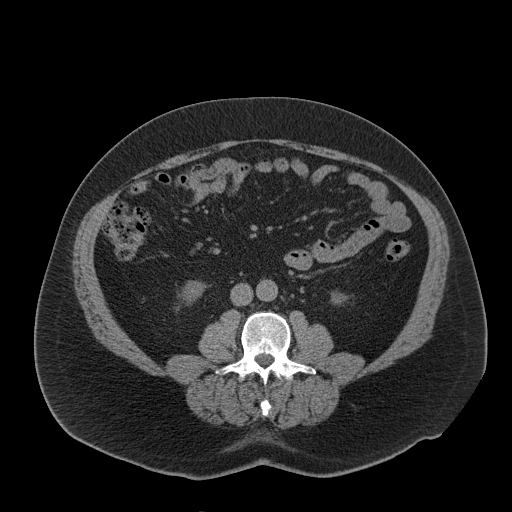
[im 70/108  soft-tissue]
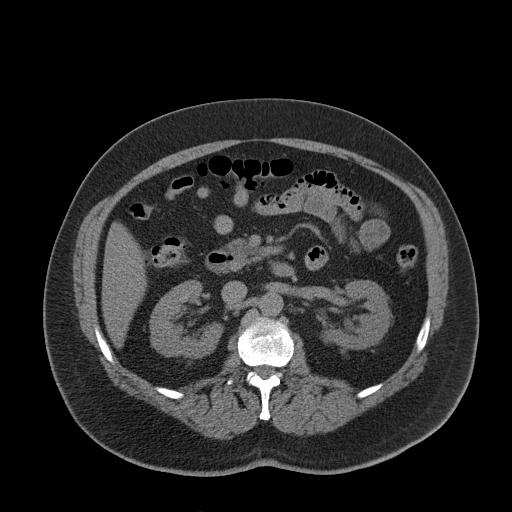
[im 70/108  bone]
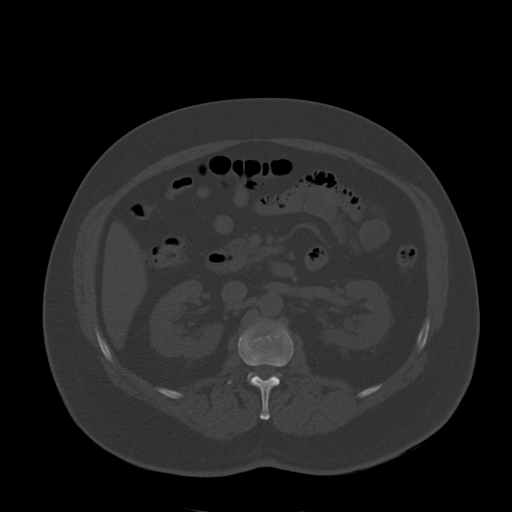
[im 75/108  soft-tissue]
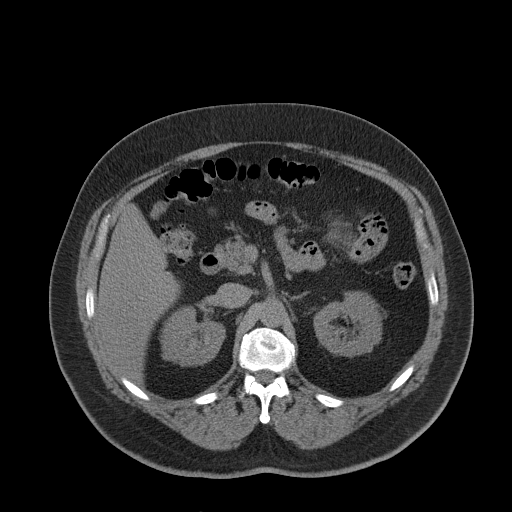
[im 86/108  soft-tissue]
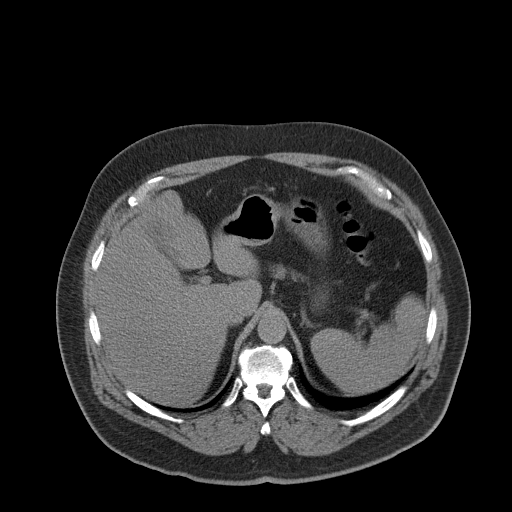
[im 91/108  soft-tissue]
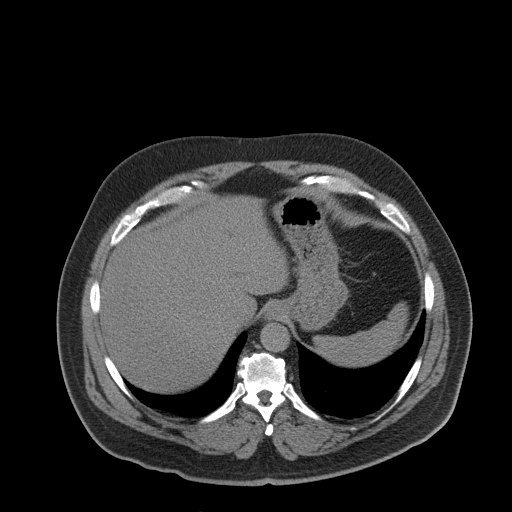
[im 102/108  soft-tissue]
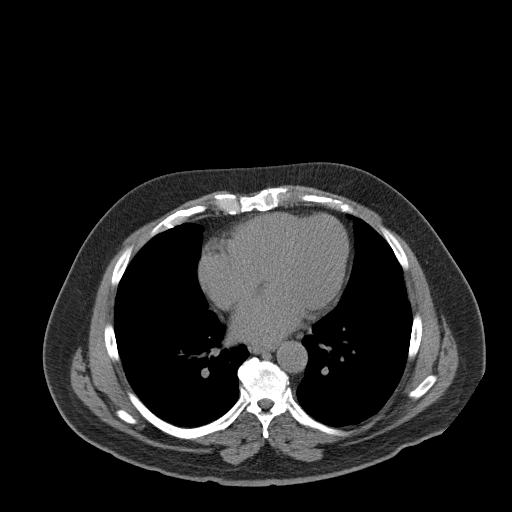

[Series 5: coronal · coronal · 0.83mm/px · 3 of 182 slices shown]
[im 61/182  soft-tissue]
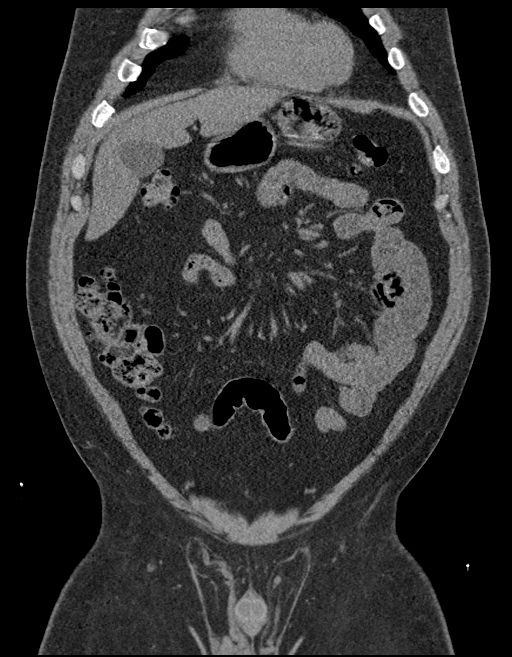
[im 81/182  soft-tissue]
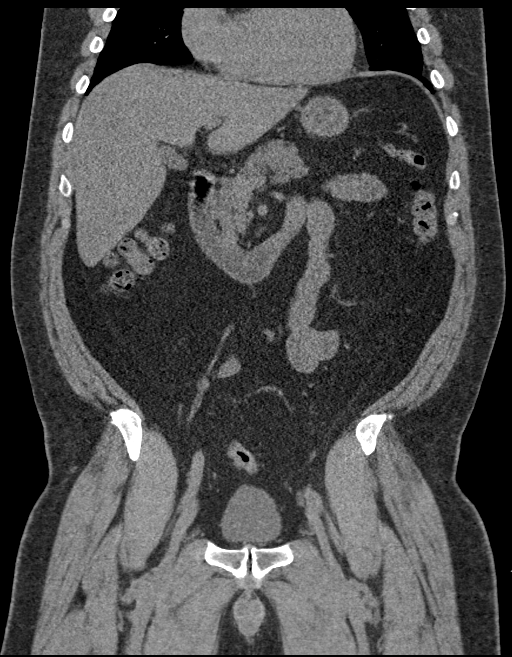
[im 101/182  soft-tissue]
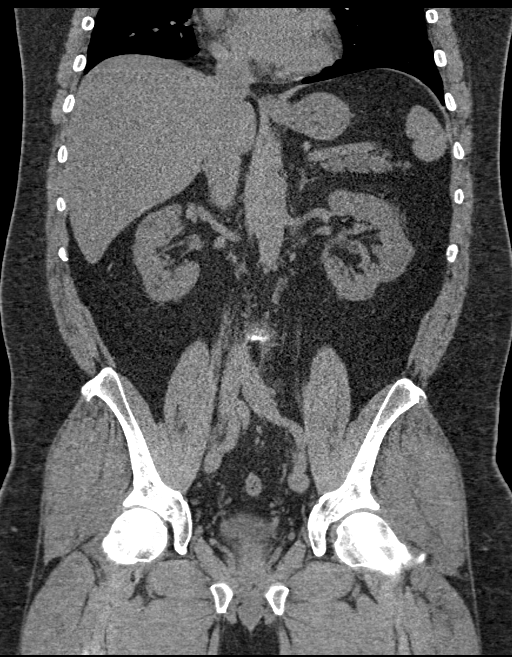

[16 of 46 positions shown; findings below may reference images not displayed]

FINDINGS: Lower chest: The lung bases are clear of acute process. No pleural
effusion or pulmonary lesions. The heart is normal in size. No
pericardial effusion. The distal esophagus and aorta are
unremarkable.

Hepatobiliary: No hepatic lesions or intrahepatic biliary
dilatation. The gallbladder is normal. No common bile duct
dilatation.

Pancreas: No mass, inflammation or ductal dilatation.

Spleen: Normal size.  No focal lesions.

Adrenals/Urinary Tract: The adrenal glands are normal.

Small lower pole left renal calculus but no obstructing ureteral
calculi or hydroureteronephrosis. No worrisome renal or bladder
lesions are identified.

Stomach/Bowel: The stomach, duodenum, small bowel and colon are
grossly normal without oral contrast. No inflammatory changes, mass
lesions or obstructive findings. The appendix is normal. Minimal
scattered diverticulosis but no findings for acute diverticulitis.

Vascular/Lymphatic: The aorta is normal in caliber. No
atheroscerlotic calcifications. No mesenteric of retroperitoneal
mass or adenopathy. Small scattered lymph nodes are noted.

Reproductive: The prostate gland and seminal vesicles are
unremarkable.

Other: No pelvic mass or pelvic adenopathy. No free pelvic fluid
collections. Bilateral inguinal hernias containing fat are noted.

Musculoskeletal: No significant bony findings. Moderate degenerative
changes noted in the lower lumbar spine.
IMPRESSION: 1. Small lower pole left renal calculus but no obstructing ureteral
calculi or hydroureteronephrosis.
2. No worrisome renal or bladder lesions without contrast.
3. No acute abdominal/pelvic findings, mass lesions or adenopathy.
4. Bilateral inguinal hernias containing fat.

## 2022-10-25 ENCOUNTER — Ambulatory Visit: Admit: 2022-10-25 | Payer: Managed Care, Other (non HMO)

## 2022-10-26 ENCOUNTER — Ambulatory Visit: Payer: Managed Care, Other (non HMO)

## 2023-07-09 ENCOUNTER — Encounter: Payer: Self-pay | Admitting: Gastroenterology

## 2023-08-11 ENCOUNTER — Ambulatory Visit: Payer: Self-pay | Admitting: *Deleted

## 2023-08-11 VITALS — Ht 70.0 in | Wt 260.0 lb

## 2023-08-11 DIAGNOSIS — Z1211 Encounter for screening for malignant neoplasm of colon: Secondary | ICD-10-CM

## 2023-08-11 MED ORDER — NA SULFATE-K SULFATE-MG SULF 17.5-3.13-1.6 GM/177ML PO SOLN: 1.0000 | Freq: Once | ORAL | 0 refills | Status: AC

## 2023-08-11 NOTE — Progress Notes (Signed)
No egg or soy allergy known to patient  No issues known to pt with past sedation with any surgeries or procedures Patient denies ever being told they had issues or difficulty with intubation  No FH of Malignant Hyperthermia Pt is not on diet pills Pt is not on  home 02  Pt is not on blood thinners  Pt denies issues with constipation  No A fib or A flutter Have any cardiac testing pending--no Pt instructed to use Single[employer]com or GoodRx for a price reduction on prep  Patient's chart reviewed by Cathlyn Parsons CNRA prior to previsit and patient appropriate for the [facility].  Previsit completed and red dot placed by patient's name on their procedure day (on provider's schedule).

## 2023-08-26 ENCOUNTER — Encounter: Payer: Self-pay | Admitting: Gastroenterology

## 2023-09-02 ENCOUNTER — Encounter: Payer: Self-pay | Admitting: Gastroenterology

## 2023-09-02 ENCOUNTER — Ambulatory Visit (AMBULATORY_SURGERY_CENTER): Payer: No Typology Code available for payment source | Admitting: Gastroenterology

## 2023-09-02 VITALS — BP 128/83 | HR 57 | Temp 98.1°F | Resp 12 | Ht 70.0 in | Wt 260.0 lb

## 2023-09-02 DIAGNOSIS — Z1211 Encounter for screening for malignant neoplasm of colon: Secondary | ICD-10-CM

## 2023-09-02 MED ORDER — SODIUM CHLORIDE 0.9 % IV SOLN: 500.0000 mL | INTRAVENOUS | Status: DC

## 2023-09-02 NOTE — Progress Notes (Signed)
Vss nad trans to pacu 

## 2023-09-02 NOTE — Op Note (Addendum)
[facility] Patient Name: Christopher Phelps Procedure Date: 09/02/2023 9:00 AM MRN: [id number] Endoscopist: Meryl Dare , MD, [phone] Age: 62 Referring MD:  Date of Birth: Mar 05, 1961 Gender: Male Account #: [id number] Procedure:                Colonoscopy Indications:              Screening for colorectal malignant neoplasm Medicines:                Monitored Anesthesia Care Procedure:                Pre-Anesthesia Assessment:                           - Prior to the procedure, a History and Physical                            was performed, and patient medications and                            allergies were reviewed. The patient's tolerance of                            previous anesthesia was also reviewed. The risks                            and benefits of the procedure and the sedation                            options and risks were discussed with the patient.                            All questions were answered, and informed consent                            was obtained. Prior Anticoagulants: The patient has                            taken no anticoagulant or antiplatelet agents. ASA                            Grade Assessment: III - A patient with severe                            systemic disease. After reviewing the risks and                            benefits, the patient was deemed in satisfactory                            condition to undergo the procedure.                           After obtaining informed consent, the colonoscope  was passed under direct vision. Throughout the                            procedure, the patient's blood pressure, pulse, and                            oxygen saturations were monitored continuously. The                            Olympus CF-HQ190L [id number]) Colonoscope was                            introduced through the anus and advanced to the the                            cecum, identified  by appendiceal orifice and                            ileocecal valve. The ileocecal valve, appendiceal                            orifice, and rectum were photographed. The quality                            of the bowel preparation was adequate. The                            colonoscopy was performed without difficulty. The                            patient tolerated the procedure well. Scope In: 9:08:30 AM Scope Out: 9:21:21 AM Scope Withdrawal Time: 0 hours 11 minutes 32 seconds  Total Procedure Duration: 0 hours 12 minutes 51 seconds  Findings:                 The perianal and digital rectal examinations were                            normal.                           Multiple medium-mouthed and small-mouthed                            diverticula were found in the sigmoid colon,                            descending colon and ascending colon. There was no                            evidence of diverticular bleeding.                           The exam was otherwise without abnormality on  direct and retroflexion views. Complications:            No immediate complications. Estimated blood loss:                            None. Estimated Blood Loss:     Estimated blood loss: none. Impression:               - Moderate diverticulosis in the sigmoid colon, in                            the descending colon and in the ascending colon.                           - The examination was otherwise normal on direct                            and retroflexion views.                           - No specimens collected. Recommendation:           - Repeat colonoscopy in 10 years for screening                            purposes.                           - Patient has a contact number available for                            emergencies. The signs and symptoms of potential                            delayed complications were discussed with the                             patient. Return to normal activities tomorrow.                            Written discharge instructions were provided to the                            patient.                           - Resume previous diet with high fiber added.                           - Continue present medications. Meryl Dare, MD 09/02/2023 9:35:07 AM This report has been signed electronically.

## 2023-09-02 NOTE — Progress Notes (Signed)
History & Physical  Primary Care Physician:  Pcp, No Primary Gastroenterologist: Claudette Head, MD  Impression / Plan:  Average risk CRC screening for colonoscopy  CHIEF COMPLAINT:  CRC screening   HPI: Christopher Phelps is a 62 y.o. male average risk CRC screening for colonoscopy.    Past Medical History:  Diagnosis Date   Arthritis    hands   Back pain    Hematochezia    History of anal fissures    Sleep apnea    c pap    Past Surgical History:  Procedure Laterality Date   COLONOSCOPY     INCISION AND DRAINAGE ABSCESS Left 05/28/2017   Procedure: INCISION AND DRAINAGE ABSCESS;  Surgeon: Betha Loa, MD;  Location: [facility];  Service: Orthopedics;  Laterality: Left;   kidney stones  11/25/2007   2 times   KNEE SURGERY Left    partial replacement    Prior to Admission medications   Medication Sig Start Date End Date Taking? Authorizing Provider  ALPRAZolam Prudy Feeler) 0.5 MG tablet Take 0.5 mg by mouth at bedtime as needed for anxiety.   Yes [provider]  Ascorbic Acid (VITAMIN C PO) Take 1,000 mg by mouth.   Yes [provider]  Calcium Carb-Cholecalciferol (CALCIUM 600 + D PO) Take by mouth daily.   Yes [provider]  celecoxib (CELEBREX) 200 MG capsule Take 200 mg by mouth daily. 07/15/23  Yes [provider]  cetirizine (ZYRTEC) 10 MG tablet Take 10 mg by mouth daily.   Yes [provider]  Multiple Vitamins-Minerals (MULTIVITAMIN ADULTS) TABS Take 1 tablet by mouth daily.   Yes [provider]  Probiotic Product (PROBIOTIC DAILY PO) Take by mouth daily.   Yes [provider]  ibuprofen (ADVIL,MOTRIN) 200 MG tablet Take 400 mg by mouth every 6 (six) hours as needed for moderate pain.     [provider]  neomycin-polymyxin b-dexamethasone (MAXITROL) 3.5-10000-0.1 SUSP 1 drop as needed. 07/01/23   [provider]    Current Outpatient Medications  Medication Sig  Dispense Refill   ALPRAZolam (XANAX) 0.5 MG tablet Take 0.5 mg by mouth at bedtime as needed for anxiety.     Ascorbic Acid (VITAMIN C PO) Take 1,000 mg by mouth.     Calcium Carb-Cholecalciferol (CALCIUM 600 + D PO) Take by mouth daily.     celecoxib (CELEBREX) 200 MG capsule Take 200 mg by mouth daily.     cetirizine (ZYRTEC) 10 MG tablet Take 10 mg by mouth daily.     Multiple Vitamins-Minerals (MULTIVITAMIN ADULTS) TABS Take 1 tablet by mouth daily.     Probiotic Product (PROBIOTIC DAILY PO) Take by mouth daily.     ibuprofen (ADVIL,MOTRIN) 200 MG tablet Take 400 mg by mouth every 6 (six) hours as needed for moderate pain.      neomycin-polymyxin b-dexamethasone (MAXITROL) 3.5-10000-0.1 SUSP 1 drop as needed.     No current facility-administered medications for this visit.    Allergies as of 09/02/2023 - Review Complete 09/02/2023  Allergen Reaction Noted   Naproxen sodium Other (See Comments) 10/20/2022   Penicillins  10/15/2012    Family History  Problem Relation Age of Onset   Heart disease Mother    Heart disease Father    Prostate cancer Father    Colon cancer Neg Hx    Colon polyps Neg Hx    Crohn's disease Neg Hx    Esophageal cancer Neg Hx    Rectal  cancer Neg Hx    Stomach cancer Neg Hx    Ulcerative colitis Neg Hx     Social History   Socioeconomic History   Marital status: Married    Spouse name: Not on file   Number of children: Not on file   Years of education: Not on file   Highest education level: Not on file  Occupational History   Not on file  Tobacco Use   Smoking status: Never   Smokeless tobacco: Never  Vaping Use   Vaping status: Never Used  Substance and Sexual Activity   Alcohol use: Yes    Alcohol/week: 3.0 standard drinks of alcohol    Types: 3 Shots of liquor per week    Comment: socially   Drug use: No   Sexual activity: Not on file  Other Topics Concern   Not on file  Social History Narrative   Not on file   Social  Determinants of Health   Financial Resource Strain: Not on file  Food Insecurity: No Food Insecurity (12/26/2021)   Received from [facility], [facility]   Hunger Vital Sign    Worried About Running Out of Food in the Last Year: Never true    Ran Out of Food in the Last Year: Never true  Transportation Needs: Not on file  Physical Activity: Not on file  Stress: Not on file  Social Connections: Unknown (04/07/2022)   Received from [facility], [facility]   Social Network    Social Network: Not on file  Intimate Partner Violence: Unknown (02/27/2022)   Received from [facility], [facility]   HITS    Physically Hurt: Not on file    Insult or Talk Down To: Not on file    Threaten Physical Harm: Not on file    Scream or Curse: Not on file    Review of Systems:  All systems reviewed were negative except where noted in HPI.   Physical Exam:  General:  Alert, well-developed, in NAD Head:  Normocephalic and atraumatic. Eyes:  Sclera clear, no icterus.   Conjunctiva pink. Ears:  Normal auditory acuity. Mouth:  No deformity or lesions.  Neck:  Supple; no masses. Lungs:  Clear throughout to auscultation.   No wheezes, crackles, or rhonchi.  Heart:  Regular rate and rhythm; no murmurs. Abdomen:  Soft, nondistended, nontender. No masses, hepatomegaly. No palpable masses.  Normal bowel sounds.    Rectal:  Deferred   Msk:  Symmetrical without gross deformities. Extremities:  Without edema. Neurologic:  Alert and  oriented x 4; grossly normal neurologically. Skin:  Intact without significant lesions or rashes. Psych:  Alert and cooperative. Normal mood and affect.   [name]. Russella Dar  09/02/2023, 8:45 AM See Loretha Stapler, [facility], to contact our on call provider

## 2023-09-02 NOTE — Patient Instructions (Addendum)
Handouts provided on diverticulosis and high fiber diet.  Resume previous diet with high fiber diet added ( see handout).  Continue present medications.  Repeat colonoscopy in 10 years for screening purposes.   YOU HAD AN ENDOSCOPIC PROCEDURE TODAY AT THE [facility]:   Refer to the procedure report that was given to you for any specific questions about what was found during the examination.  If the procedure report does not answer your questions, please call your gastroenterologist to clarify.  If you requested that your care partner not be given the details of your procedure findings, then the procedure report has been included in a sealed envelope for you to review at your convenience later.  YOU SHOULD EXPECT: Some feelings of bloating in the abdomen. Passage of more gas than usual.  Walking can help get rid of the air that was put into your GI tract during the procedure and reduce the bloating. If you had a lower endoscopy (such as a colonoscopy or flexible sigmoidoscopy) you may notice spotting of blood in your stool or on the toilet paper. If you underwent a bowel prep for your procedure, you may not have a normal bowel movement for a few days.  Please Note:  You might notice some irritation and congestion in your nose or some drainage.  This is from the oxygen used during your procedure.  There is no need for concern and it should clear up in a day or so.  SYMPTOMS TO REPORT IMMEDIATELY:  Following lower endoscopy (colonoscopy or flexible sigmoidoscopy):  Excessive amounts of blood in the stool  Significant tenderness or worsening of abdominal pains  Swelling of the abdomen that is new, acute  Fever of 100F or higher  For urgent or emergent issues, a gastroenterologist can be reached at any hour by calling [phone]. Do not use MyChart messaging for urgent concerns.    DIET:  We do recommend a small meal at first, but then you may proceed to your regular diet.  Drink  plenty of fluids but you should avoid alcoholic beverages for 24 hours.  ACTIVITY:  You should plan to take it easy for the rest of today and you should NOT DRIVE or use heavy machinery until tomorrow (because of the sedation medicines used during the test).    FOLLOW UP: Our staff will call the number listed on your records the next business day following your procedure.  We will call around 7:15- 8:00 am to check on you and address any questions or concerns that you may have regarding the information given to you following your procedure. If we do not reach you, we will leave a message.     If any biopsies were taken you will be contacted by phone or by letter within the next 1-3 weeks.  Please call us at [phone] if you have not heard about the biopsies in 3 weeks.    SIGNATURES/CONFIDENTIALITY: You and/or your care partner have signed paperwork which will be entered into your electronic medical record.  These signatures attest to the fact that that the information above on your After Visit Summary has been reviewed and is understood.  Full responsibility of the confidentiality of this discharge information lies with you and/or your care-partner.

## 2023-09-03 ENCOUNTER — Telehealth: Payer: Self-pay

## 2023-09-03 NOTE — Telephone Encounter (Signed)
Follow up call placed, no answer and no VM.
